# Patient Record
Sex: Female | Born: 1970 | Race: Black or African American | Hispanic: No | Marital: Single | Smoking: Former smoker
Health system: Southern US, Community
[De-identification: ages and names within clinical notes are randomized; demographics above are authoritative.]

## PROBLEM LIST (undated history)

## (undated) DIAGNOSIS — I10 Essential (primary) hypertension: Secondary | ICD-10-CM

## (undated) DIAGNOSIS — R7303 Prediabetes: Secondary | ICD-10-CM

## (undated) DIAGNOSIS — T7840XA Allergy, unspecified, initial encounter: Secondary | ICD-10-CM

## (undated) DIAGNOSIS — S82851A Displaced trimalleolar fracture of right lower leg, initial encounter for closed fracture: Secondary | ICD-10-CM

## (undated) HISTORY — PX: WRIST SURGERY: SHX841

---

## 2000-08-17 ENCOUNTER — Encounter: Admission: RE | Admit: 2000-08-17 | Discharge: 2000-08-17 | Payer: Self-pay | Admitting: Emergency Medicine

## 2000-08-17 ENCOUNTER — Encounter: Payer: Self-pay | Admitting: Emergency Medicine

## 2000-08-23 ENCOUNTER — Encounter: Payer: Self-pay | Admitting: Emergency Medicine

## 2000-08-23 ENCOUNTER — Encounter: Admission: RE | Admit: 2000-08-23 | Discharge: 2000-08-23 | Payer: Self-pay | Admitting: Emergency Medicine

## 2004-10-25 ENCOUNTER — Ambulatory Visit: Payer: Self-pay | Admitting: *Deleted

## 2004-10-25 ENCOUNTER — Ambulatory Visit: Payer: Self-pay | Admitting: Nurse Practitioner

## 2004-11-04 ENCOUNTER — Ambulatory Visit: Payer: Self-pay | Admitting: Nurse Practitioner

## 2005-03-28 ENCOUNTER — Ambulatory Visit: Payer: Self-pay | Admitting: Nurse Practitioner

## 2007-03-20 ENCOUNTER — Ambulatory Visit (HOSPITAL_COMMUNITY): Admission: EM | Admit: 2007-03-20 | Discharge: 2007-03-21 | Payer: Self-pay | Admitting: Emergency Medicine

## 2010-09-27 NOTE — Op Note (Signed)
NAMECHARITIE, HINOTE                  ACCOUNT NO.:  [id number]   MEDICAL RECORD NO.:  [id number]          PATIENT TYPE:  INP   LOCATION:  2550                         FACILITY:  [facility]   PHYSICIAN:  Madelynn Done, MD  DATE OF BIRTH:  05/25/1970   DATE OF PROCEDURE:  03/20/2007  DATE OF DISCHARGE:                               OPERATIVE REPORT   PREOPERATIVE DIAGNOSES:  1. Right forearm complex laceration 12 x 6 cm.  2. Right forearm pit bull dog bite.   POSTOPERATIVE DIAGNOSES:  1. Right forearm complex laceration 12 x 6 cm.  2. Right forearm pit bull dog bite.   ATTENDING SURGEON:  Dr. Bradly Bienenstock who was scrubbed and present for  the entire procedure.   ASSISTANT SURGEON:  None.   PROCEDURE:  1. Debridement of skin and subcutaneous tissue right forearm.  2. Complex wound closure right forearm, 12 x 6 cm.   ANESTHESIA:  General via LMA.   TOURNIQUET TIME:  Less than 15 minutes at 250 mmHg.   INTRAOPERATIVE FINDINGS:  The patient did have a large complex  laceration to the dorsal radial aspect of the forearm over the region of  the brachial radialis extending to the fourth dorsal compartment and  extending to the FCR volarly.  The radial artery was in continuity.  The  superficial radial nerve, there were some terminal branches that were  disrupted.  There were some sensory branches of the radial nerve that  were disrupted.  However, the large portion of the superficial radial  nerve was in continuity.  The first dorsal compartment, second dorsal  compartment, third and fourth dorsal compartments were both in  continuity.  The laceration extended down to the fascia and extensor  retinaculum.  There was some small rents in the fascia.   SURGICAL INDICATIONS:  Mrs. Edmundson as a 40 year old right hand dominant  female who sustained the above-mentioned dog bite.  The patient seen and  evaluated in emergency department and due to complex nature of the  wound, was consented to  proceed to the operating room for the above  treatment.  Risks, benefits and alternatives discussed in detail.  Risks  include but not limited to bleeding, infection, nerve damage, tendon  injury, need for further surgical intervention, skin grafting and loss  of motion of the wrist forearm and digits.   DESCRIPTION OF PROCEDURE:  The patient was properly identified in the  preop holding area and a mark with permanent marker was made on the  right forearm to indicate the correct operative site.  The patient then  brought back to the operating room, placed supine on anesthesia table.  General anesthesia was administered via LMA.  The patient tolerated this  well.  Well-padded tourniquet was then placed on the right brachium and  sealed with a 1000 drape.  The right upper extremity were prepped with  Betadine and sterilely draped.  The wound was then measured.  It  measured 12 cm in largest transverse diameter and the largest width was  6 cm.  Following measurements of the wound  the skin and subcutaneous  tissues were then sharply debrided with a scalpel as well as scissors.  After debridement of the skin and subcutaneous tissues, the wounds were  then thoroughly irrigated with a pulsatile lavage saline solution.  After irrigation and debridement the skin flaps were then advanced and  the wound closed with 3-0 nylon horizontal mattress and simple sutures.  Following completion of the complex wound closure 0.25% Marcaine 15 mL  were then infiltrated around the wound.  The tourniquet was deflated  with good perfusion of the fingers.  The wound was then dressed with  Adaptic and sterile compressive dressing was then applied and the  patient was then placed in a well-padded volar resting hand and wrist  splint made out of plaster.  She was extubated and taken to recovery  room in good condition.   POSTOPERATIVE PLAN:  The patient be discharged to home.  She will go  home on oral antibiotics  and oral pain medication.  She will be seen  back in the office in 5 days for wound check.  Put her back into another  splint at that time and likely sutures out around the 2-week mark.  Continue to follow her based on her wound healing.      Madelynn Done, MD  Electronically Signed     FWO/MEDQ  D:  03/20/2007  T:  03/21/2007  Job:  [id number]

## 2011-07-27 ENCOUNTER — Encounter (INDEPENDENT_AMBULATORY_CARE_PROVIDER_SITE_OTHER): Payer: Self-pay | Admitting: Surgery

## 2017-09-17 ENCOUNTER — Other Ambulatory Visit: Payer: Self-pay

## 2017-09-17 ENCOUNTER — Emergency Department (HOSPITAL_COMMUNITY)
Admission: EM | Admit: 2017-09-17 | Discharge: 2017-09-17 | Disposition: A | Discharge status: Home / Self Care | Payer: BC Managed Care – PPO | Attending: Emergency Medicine | Admitting: Emergency Medicine

## 2017-09-17 ENCOUNTER — Encounter (HOSPITAL_COMMUNITY): Payer: Self-pay

## 2017-09-17 DIAGNOSIS — Z87891 Personal history of nicotine dependence: Secondary | ICD-10-CM | POA: Insufficient documentation

## 2017-09-17 DIAGNOSIS — M542 Cervicalgia: Secondary | ICD-10-CM | POA: Diagnosis not present

## 2017-09-17 MED ORDER — HYDROCODONE-ACETAMINOPHEN 5-325 MG PO TABS: 1.0000 | ORAL_TABLET | Freq: Four times a day (QID) | ORAL | 0 refills | Status: DC | PRN

## 2017-09-17 NOTE — ED Notes (Signed)
ED Provider at bedside. 

## 2017-09-17 NOTE — ED Triage Notes (Signed)
patient c/o right ear pain x 10 days. Patient states it started as nasal congestion. Patient states she went to a physician 6 days ago for an ear infection-antibiotic prescribed. Patient went again today and states that the swelling was decreased. Patient was told by her PCP that she needed to come to the ED since she was still c/o ear pain for possible CT scan.

## 2017-09-18 NOTE — ED Provider Notes (Signed)
[facility] DEPT Provider Note   CSN: [id number] Arrival date & time: 09/17/17  0737     History   Chief Complaint Chief Complaint  Patient presents with  . Otalgia    HPI Claudia Le is a 47 y.o. female.  HPI Patient presents with right-sided ear and neck pain.  Diagnosed with ear infection 6 days ago and started on Augmentin.  States that she followed back up today.  Still has some pain but the swelling is decreased reportedly.  Has some nasal congestion and some pain with swallowing.  No fevers.  States she was sent in for a CAT scan because the doctor did not have what they needed to further evaluate.  No fevers.  Slight difficulty hearing out of that ear.  No difficulty moving her neck.  The pain is below her ear on the right side. History reviewed. No pertinent past medical history.  There are no active problems to display for this patient.   Past Surgical History:  Procedure Laterality Date  . WRIST SURGERY       OB History   None      Home Medications    Prior to Admission medications   Medication Sig Start Date End Date Taking? Authorizing Provider  HYDROcodone-acetaminophen (NORCO/VICODIN) 5-325 MG tablet Take 1-2 tablets by mouth every 6 (six) hours as needed. 09/17/17   Davonna Belling, MD    Family History Family History  Problem Relation Age of Onset  . Thyroid disease Mother   . Glaucoma Father   . Hypertension Brother     Social History Social History   Tobacco Use  . Smoking status: Former Research scientist (life sciences)  . Smokeless tobacco: Never Used  Substance Use Topics  . Alcohol use: Yes    Comment: occasionally  . Drug use: Yes    Types: Marijuana    Comment: occasionally     Allergies   Patient has no known allergies.   Review of Systems Review of Systems  Constitutional: Negative for appetite change.  HENT: Positive for congestion, ear pain and sore throat. Negative for trouble swallowing.   Respiratory:  Negative for shortness of breath.   Cardiovascular: Negative for chest pain.  Gastrointestinal: Negative for abdominal distention.  Genitourinary: Negative for flank pain.  Musculoskeletal: Negative for back pain.  Skin: Negative for rash.     Physical Exam Updated Vital Signs BP (!) 168/94 (BP Location: Right Arm)   Pulse 62   Temp 98.1 F (36.7 C) (Oral)   Resp 18   Ht 5' 2"  (1.575 m)   Wt 90.7 kg (200 lb)   SpO2 100%   BMI 36.58 kg/m   Physical Exam  Constitutional: She appears well-developed.  HENT:  Head: Normocephalic.  Right TM normal.  No tenderness over mastoid.  Some tenderness over musculature of right neck.  Anterior cervical lymphadenopathy.  Good range of motion.  Eyes: EOM are normal.  Neck: Neck supple.  Cardiovascular: Normal rate.  Pulmonary/Chest: Effort normal.  Abdominal: Soft. There is no tenderness.  Musculoskeletal: She exhibits no edema.  Neurological: She is alert.  Skin: Skin is warm.     ED Treatments / Results  Labs (all labs ordered are listed, but only abnormal results are displayed) Labs Reviewed - No data to display  EKG None  Radiology No results found.  Procedures Procedures (including critical care time)  Medications Ordered in ED Medications - No data to display   Initial Impression / Assessment and Plan /  ED Course  I have reviewed the triage vital signs and the nursing notes.  Pertinent labs & imaging results that were available during my care of the patient were reviewed by me and considered in my medical decision making (see chart for details).     Patient with ear pain.  Ear actually looks better than reported previously.  No tenderness over mastoid.  Reportedly sent in to rule out mastoiditis but clinically she does not have a mastoiditis.  No tenderness or swelling of her mastoid.  Pain appears to be down more over the neck.  Good movement of the jaw.  Will continue the antibiotic course and was given some pain  medicines which patient states is what she wanted and was not given from the other provider.  Final Clinical Impressions(s) / ED Diagnoses   Final diagnoses:  Neck pain    ED Discharge Orders        Ordered    HYDROcodone-acetaminophen (NORCO/VICODIN) 5-325 MG tablet  Every 6 hours PRN     09/17/17 1033       Davonna Belling, MD 09/18/17 [phone]

## 2019-07-24 ENCOUNTER — Ambulatory Visit: Payer: BC Managed Care – PPO | Attending: Family

## 2019-07-24 DIAGNOSIS — Z23 Encounter for immunization: Secondary | ICD-10-CM

## 2019-07-24 NOTE — Progress Notes (Signed)
   Covid-19 Vaccination Clinic  Name:  Claudia Le    MRN: [id number] DOB: October 14, 1970  07/24/2019  [name] was observed post Covid-19 immunization for 15 minutes without incident. She was provided with Vaccine Information Sheet and instruction to access the V-Safe system.   [name] was instructed to call 911 with any severe reactions post vaccine: Marland Kitchen Difficulty breathing  . Swelling of face and throat  . A fast heartbeat  . A bad rash all over body  . Dizziness and weakness   Immunizations Administered    Name Date Dose VIS Date Route   Moderna COVID-19 Vaccine 07/24/2019 12:54 PM 0.5 mL 04/15/2019 Intramuscular   Manufacturer: Moderna   Lot: [id number]   [place]: [id number]

## 2019-08-26 ENCOUNTER — Ambulatory Visit: Payer: BC Managed Care – PPO | Attending: Family

## 2019-08-26 DIAGNOSIS — Z23 Encounter for immunization: Secondary | ICD-10-CM

## 2019-08-26 NOTE — Progress Notes (Signed)
   Covid-19 Vaccination Clinic  Name:  ABAGAEL KRAMM    MRN: [id number] DOB: 1971/03/04  08/26/2019  Ms. Cowles was observed post Covid-19 immunization for 15 minutes without incident. She was provided with Vaccine Information Sheet and instruction to access the V-Safe system.   Ms. Thoennes was instructed to call 911 with any severe reactions post vaccine: Marland Kitchen Difficulty breathing  . Swelling of face and throat  . A fast heartbeat  . A bad rash all over body  . Dizziness and weakness   Immunizations Administered    Name Date Dose VIS Date Route   Moderna COVID-19 Vaccine 08/26/2019 11:41 AM 0.5 mL 04/15/2019 Intramuscular   Manufacturer: Moderna   Lot: [id number]   [place]: [id number]

## 2019-10-23 ENCOUNTER — Encounter (HOSPITAL_COMMUNITY): Payer: Self-pay

## 2019-10-23 ENCOUNTER — Emergency Department (HOSPITAL_COMMUNITY): Payer: BC Managed Care – PPO

## 2019-10-23 ENCOUNTER — Emergency Department (HOSPITAL_COMMUNITY)
Admission: EM | Admit: 2019-10-23 | Discharge: 2019-10-23 | Disposition: A | Discharge status: Home / Self Care | Payer: BC Managed Care – PPO | Attending: Emergency Medicine | Admitting: Emergency Medicine

## 2019-10-23 DIAGNOSIS — Y998 Other external cause status: Secondary | ICD-10-CM | POA: Insufficient documentation

## 2019-10-23 DIAGNOSIS — Y939 Activity, unspecified: Secondary | ICD-10-CM | POA: Insufficient documentation

## 2019-10-23 DIAGNOSIS — W19XXXA Unspecified fall, initial encounter: Secondary | ICD-10-CM | POA: Insufficient documentation

## 2019-10-23 DIAGNOSIS — S82851A Displaced trimalleolar fracture of right lower leg, initial encounter for closed fracture: Secondary | ICD-10-CM | POA: Diagnosis not present

## 2019-10-23 DIAGNOSIS — S99911A Unspecified injury of right ankle, initial encounter: Secondary | ICD-10-CM | POA: Diagnosis present

## 2019-10-23 DIAGNOSIS — Z87891 Personal history of nicotine dependence: Secondary | ICD-10-CM | POA: Insufficient documentation

## 2019-10-23 DIAGNOSIS — F1210 Cannabis abuse, uncomplicated: Secondary | ICD-10-CM | POA: Diagnosis not present

## 2019-10-23 DIAGNOSIS — Y929 Unspecified place or not applicable: Secondary | ICD-10-CM | POA: Insufficient documentation

## 2019-10-23 MED ORDER — OXYCODONE HCL 5 MG PO TABS
5.0000 mg | ORAL_TABLET | Freq: Once | ORAL | Status: AC
  Administered 2019-10-23: 5 mg via ORAL
  Filled 2019-10-23: qty 1

## 2019-10-23 MED ORDER — OXYCODONE HCL 5 MG PO TABS: 5.0000 mg | ORAL_TABLET | Freq: Four times a day (QID) | ORAL | 0 refills | Status: AC | PRN

## 2019-10-23 NOTE — ED Triage Notes (Signed)
Pt presents with c/o right ankle pain. Pt reports that she fell this morning, landing on her ankle. Pt does have some swelling to that right ankle.

## 2019-10-23 NOTE — Discharge Instructions (Addendum)
Please do not bear any weight to her right lower extremity.  Use crutches.  Follow-up with orthopedics.  Use Tylenol, Motrin, Roxicodone for pain.  Please call his office today and confirm an appointment for tomorrow.

## 2019-10-23 NOTE — ED Provider Notes (Signed)
[place] DEPT Provider Note   CSN: [id number] Arrival date & time: 10/23/19  1005     History Chief Complaint  Patient presents with  . Fall  . Ankle Pain    Claudia Le is a 49 y.o. female.  The history is provided by the patient.  Ankle Pain Location:  Ankle Injury: yes   Ankle location:  R ankle Pain details:    Quality:  Aching   Radiates to:  Does not radiate   Severity:  Moderate   Onset quality:  Sudden   Timing:  Constant   Progression:  Unchanged Prior injury to area:  No Relieved by:  Nothing Worsened by:  Bearing weight Associated symptoms: decreased ROM   Associated symptoms: no back pain, no fatigue, no fever, no itching, no muscle weakness, no neck pain, no numbness, no stiffness, no swelling and no tingling        History reviewed. No pertinent past medical history.  There are no problems to display for this patient.   Past Surgical History:  Procedure Laterality Date  . WRIST SURGERY       OB History   No obstetric history on file.     Family History  Problem Relation Age of Onset  . Thyroid disease Mother   . Glaucoma Father   . Hypertension Brother     Social History   Tobacco Use  . Smoking status: Former Research scientist (life sciences)  . Smokeless tobacco: Never Used  Vaping Use  . Vaping Use: Never used  Substance Use Topics  . Alcohol use: Yes    Comment: occasionally  . Drug use: Yes    Types: Marijuana    Comment: occasionally    Home Medications Prior to Admission medications   Medication Sig Start Date End Date Taking? Authorizing Provider  HYDROcodone-acetaminophen (NORCO/VICODIN) 5-325 MG tablet Take 1-2 tablets by mouth every 6 (six) hours as needed. 09/17/17   Davonna Belling, MD  oxyCODONE (ROXICODONE) 5 MG immediate release tablet Take 1 tablet (5 mg total) by mouth every 6 (six) hours as needed for up to 20 doses for severe pain or breakthrough pain. 10/23/19   Lennice Sites, DO    Allergies     Patient has no known allergies.  Review of Systems   Review of Systems  Constitutional: Negative for chills, fatigue and fever.  HENT: Negative for ear pain and sore throat.   Eyes: Negative for pain and visual disturbance.  Respiratory: Negative for cough and shortness of breath.   Cardiovascular: Negative for chest pain and palpitations.  Gastrointestinal: Negative for abdominal pain and vomiting.  Genitourinary: Negative for dysuria and hematuria.  Musculoskeletal: Positive for arthralgias and joint swelling. Negative for back pain, neck pain and stiffness.  Skin: Negative for color change, itching and rash.  Neurological: Negative for seizures and syncope.  All other systems reviewed and are negative.   Physical Exam Updated Vital Signs  ED Triage Vitals  Enc Vitals Group     BP 10/23/19 1015 137/84     Pulse Rate 10/23/19 1015 91     Resp 10/23/19 1015 18     Temp 10/23/19 1015 98.2 F (36.8 C)     Temp Source 10/23/19 1015 Oral     SpO2 10/23/19 1015 100 %     Weight --      Height --      Head Circumference --      Peak Flow --      Pain  Score 10/23/19 1016 10     Pain Loc --      Pain Edu? --      Excl. in [place]? --     Physical Exam Constitutional:      General: She is not in acute distress.    Appearance: She is not ill-appearing.  HENT:     Head: Normocephalic.     Nose: Nose normal.     Mouth/Throat:     Mouth: Mucous membranes are moist.  Eyes:     Pupils: Pupils are equal, round, and reactive to light.  Cardiovascular:     Pulses: Normal pulses.  Musculoskeletal:        General: Swelling and tenderness (TTP around right ankle with swelling, decreased ROM) present.     Cervical back: Normal range of motion.  Skin:    General: Skin is warm.  Neurological:     General: No focal deficit present.     Mental Status: She is alert.     Sensory: No sensory deficit.     Motor: No weakness.     ED Results / Procedures / Treatments   Labs (all labs  ordered are listed, but only abnormal results are displayed) Labs Reviewed - No data to display  EKG None  Radiology DG Tibia/Fibula Right  Result Date: 10/23/2019 CLINICAL DATA:  49 year old female with history of trauma from a fall complaining of right ankle pain. EXAM: RIGHT TIBIA AND FIBULA - 2 VIEW COMPARISON:  No priors. FINDINGS: Trimalleolar (anterior, posterior and medial malleolar) fracture of the distal tibia. Known distal fibular fracture not well demonstrated on this examination, but clearly demonstrated on dedicated ankle radiographs. Proximal aspect of the tibia and fibula are otherwise intact. IMPRESSION: 1. Trimalleolar fracture of the distal tibia. 2. Oblique displaced fracture of the distal fibula not well visualized on this examination, but better demonstrated on contemporaneously obtained ankle radiographs. Electronically Signed   By: Vinnie Langton M.D.   On: 10/23/2019 11:50   DG Ankle Complete Right  Result Date: 10/23/2019 CLINICAL DATA:  Pain following fall EXAM: RIGHT ANKLE - COMPLETE 3+ VIEW COMPARISON:  None. FINDINGS: Frontal, oblique, and lateral views were obtained. There is soft tissue swelling. There is an obliquely oriented fracture of the distal fibular diaphysis with mild lateral displacement distally. There is an obliquely oriented comminuted fracture of the medial malleolus with slight separation of fracture fragments. There is no appreciable joint effusion. There is no joint space narrowing or erosion. Ankle mortise appears grossly intact. IMPRESSION: Fractures of the medial malleolus and distal fibula with mild displacement of fracture fragments. Ankle mortise appears grossly intact. No appreciable arthropathy. Electronically Signed   By: Lowella Grip III M.D.   On: 10/23/2019 11:16   DG Foot Complete Right  Result Date: 10/23/2019 CLINICAL DATA:  [place] today.  Right foot and ankle pain. EXAM: RIGHT FOOT COMPLETE - 3+ VIEW COMPARISON:  Ankle  radiographs, same date. FINDINGS: Trimalleolar fracture noted at the ankle. No definite foot fractures are identified. IMPRESSION: 1. Trimalleolar fractures at the ankle. 2. No definite foot fractures. Electronically Signed   By: Marijo Sanes M.D.   On: 10/23/2019 11:43    Procedures Procedures (including critical care time)  Medications Ordered in ED Medications  oxyCODONE (Oxy IR/ROXICODONE) immediate release tablet 5 mg (5 mg Oral Given 10/23/19 1058)    ED Course  I have reviewed the triage vital signs and the nursing notes.  Pertinent labs & imaging results that were available during  my care of the patient were reviewed by me and considered in my medical decision making (see chart for details).    MDM Rules/Calculators/A&P                          SHIRLEEN MCFAUL is a 49 year old female with no significant medical history presents the ED with right ankle pain after fall.  Patient with normal vitals.  No fever.  Neuromuscularly and neurovascularly intact.  There is no breakdown of the skin.  She has a good amount of soft tissue swelling around the right ankle.  Overall ankle mortise appears intact.  X-ray did show trimalleolar fracture of right ankle.  Ankle mortise is intact.  No large joint effusion.  Patient placed in an ankle splint.  Will give crutches.  Will send narcotic pain medicine to pharmacy.  Talked with Dr. Marlou Sa with orthopedics and will follow up with her outpatient.  Discharged in good condition.  This chart was dictated using voice recognition software.  Despite best efforts to proofread,  errors can occur which can change the documentation meaning.   Final Clinical Impression(s) / ED Diagnoses Final diagnoses:  Closed trimalleolar fracture of right ankle, initial encounter    Rx / DC Orders ED Discharge Orders         Ordered    oxyCODONE (ROXICODONE) 5 MG immediate release tablet  Every 6 hours PRN     Discontinue  Reprint     10/23/19 [place], [place], DO 10/23/19 1229

## 2019-10-24 ENCOUNTER — Other Ambulatory Visit: Payer: Self-pay

## 2019-10-24 ENCOUNTER — Encounter: Payer: Self-pay | Admitting: Orthopaedic Surgery

## 2019-10-24 ENCOUNTER — Encounter (HOSPITAL_BASED_OUTPATIENT_CLINIC_OR_DEPARTMENT_OTHER): Payer: Self-pay | Admitting: Orthopaedic Surgery

## 2019-10-24 ENCOUNTER — Ambulatory Visit: Payer: BC Managed Care – PPO | Admitting: Orthopaedic Surgery

## 2019-10-24 DIAGNOSIS — S82851A Displaced trimalleolar fracture of right lower leg, initial encounter for closed fracture: Secondary | ICD-10-CM | POA: Diagnosis not present

## 2019-10-24 NOTE — Progress Notes (Signed)
Office Visit Note   Patient: Claudia Le           Date of Birth: 1970-11-25           MRN: [id number] Visit Date: 10/24/2019              Requested by: No referring provider defined for this encounter. PCP: System, Pcp Not In   Assessment & Plan: Visit Diagnoses:  1. Closed displaced trimalleolar fracture of right ankle, initial encounter     Plan: Impression is right trimalleolar ankle fracture.  Patient refused short leg splint as she is claustrophobic therefore we will have to place her in a cam boot which she understands is not immobilizing.  She will keep this elevated at all times with ice.  We will allow the swelling to subside over the next week and plan for surgical repair later next week.  Risk benefits rehab recovery reviewed.  Anticipate that she will not be able to drive for approximately 3 months.  Questions encouraged and answered.  Follow-Up Instructions: Return for 2 week postop .   Orders:  No orders of the defined types were placed in this encounter.  No orders of the defined types were placed in this encounter.     Procedures: No procedures performed   Clinical Data: No additional findings.   Subjective: Chief Complaint  Patient presents with  . Right Ankle - Pain    Claudia Le is a 49 year old female who is a ER follow-up from yesterday.  She suffered a right ankle injury while she slipped on wet grass coming down the [place].  She was placed in a splint but she could not tolerate the splint and took it off herself.  She endorses swelling.  Denies any numbness and tingling.   Review of Systems  Constitutional: Negative.   HENT: Negative.   Eyes: Negative.   Respiratory: Negative.   Cardiovascular: Negative.   Endocrine: Negative.   Musculoskeletal: Negative.   Neurological: Negative.   Hematological: Negative.   Psychiatric/Behavioral: Negative.   All other systems reviewed and are negative.    Objective: Vital Signs: LMP 10/13/2019 Comment:  periods are very irreg  Physical Exam Vitals and nursing note reviewed.  Constitutional:      Appearance: She is well-developed.  HENT:     Head: Normocephalic and atraumatic.  Pulmonary:     Effort: Pulmonary effort is normal.  Abdominal:     Palpations: Abdomen is soft.  Musculoskeletal:     Cervical back: Neck supple.  Skin:    General: Skin is warm.     Capillary Refill: Capillary refill takes less than 2 seconds.  Neurological:     Mental Status: She is alert and oriented to person, place, and time.  Psychiatric:        Behavior: Behavior normal.        Thought Content: Thought content normal.        Judgment: Judgment normal.     Ortho Exam Right ankle shows moderately severe swelling with a small fracture blister on the medial side.  Neurovascular intact. Specialty Comments:  No specialty comments available.  Imaging: No results found.   PMFS History: There are no problems to display for this patient.  Past Medical History:  Diagnosis Date  . Hypertension   . Pre-diabetes   . Trimalleolar fracture of ankle, closed, right, initial encounter     Family History  Problem Relation Age of Onset  . Thyroid disease Mother   .  Glaucoma Father   . Hypertension Brother     Past Surgical History:  Procedure Laterality Date  . WRIST SURGERY     for dog bite   Social History   Occupational History  . Not on file  Tobacco Use  . Smoking status: Former Research scientist (life sciences)  . Smokeless tobacco: Never Used  Vaping Use  . Vaping Use: Never used  Substance and Sexual Activity  . Alcohol use: Yes    Comment: occasionally  . Drug use: Yes    Types: Marijuana    Comment: occasionally, last smoked 10-20-19  . Sexual activity: Not on file

## 2019-10-27 ENCOUNTER — Other Ambulatory Visit (HOSPITAL_COMMUNITY)
Admission: RE | Admit: 2019-10-27 | Discharge: 2019-10-27 | Disposition: A | Discharge status: Home / Self Care | Payer: BC Managed Care – PPO | Source: Ambulatory Visit | Attending: Orthopaedic Surgery | Admitting: Orthopaedic Surgery

## 2019-10-27 ENCOUNTER — Encounter (HOSPITAL_BASED_OUTPATIENT_CLINIC_OR_DEPARTMENT_OTHER)
Admission: RE | Admit: 2019-10-27 | Discharge: 2019-10-27 | Disposition: A | Discharge status: Home / Self Care | Payer: BC Managed Care – PPO | Source: Ambulatory Visit | Attending: Orthopaedic Surgery | Admitting: Orthopaedic Surgery

## 2019-10-27 DIAGNOSIS — Z20822 Contact with and (suspected) exposure to covid-19: Secondary | ICD-10-CM | POA: Diagnosis not present

## 2019-10-27 DIAGNOSIS — Z01812 Encounter for preprocedural laboratory examination: Secondary | ICD-10-CM | POA: Insufficient documentation

## 2019-10-27 LAB — BASIC METABOLIC PANEL
Anion gap: 16 — ABNORMAL HIGH (ref 5–15)
BUN: 6 mg/dL (ref 6–20)
CO2: 24 mmol/L (ref 22–32)
Calcium: 9.6 mg/dL (ref 8.9–10.3)
Chloride: 99 mmol/L (ref 98–111)
Creatinine, Ser: 0.78 mg/dL (ref 0.44–1.00)
GFR calc Af Amer: >60 mL/min (ref >60)
GFR calc non Af Amer: >60 mL/min (ref >60)
Glucose, Bld: 88 mg/dL (ref 70–99)
Potassium: 3.9 mmol/L (ref 3.5–5.1)
Sodium: 139 mmol/L (ref 135–145)

## 2019-10-27 LAB — SARS CORONAVIRUS 2 (TAT 6-24 HRS): SARS Coronavirus 2: NEGATIVE

## 2019-10-27 LAB — POCT PREGNANCY, URINE: Preg Test, Ur: NEGATIVE

## 2019-10-28 ENCOUNTER — Other Ambulatory Visit: Payer: Self-pay | Admitting: Family

## 2019-10-30 ENCOUNTER — Other Ambulatory Visit: Payer: Self-pay

## 2019-10-30 ENCOUNTER — Encounter (HOSPITAL_BASED_OUTPATIENT_CLINIC_OR_DEPARTMENT_OTHER): Payer: Self-pay | Admitting: Orthopaedic Surgery

## 2019-10-30 ENCOUNTER — Ambulatory Visit (HOSPITAL_BASED_OUTPATIENT_CLINIC_OR_DEPARTMENT_OTHER): Payer: BC Managed Care – PPO | Admitting: Anesthesiology

## 2019-10-30 ENCOUNTER — Encounter (HOSPITAL_BASED_OUTPATIENT_CLINIC_OR_DEPARTMENT_OTHER)
Admission: RE | Disposition: A | Discharge status: Home / Self Care | Payer: Self-pay | Source: Home / Self Care | Attending: Orthopaedic Surgery

## 2019-10-30 ENCOUNTER — Ambulatory Visit (HOSPITAL_COMMUNITY): Payer: BC Managed Care – PPO

## 2019-10-30 ENCOUNTER — Ambulatory Visit (HOSPITAL_BASED_OUTPATIENT_CLINIC_OR_DEPARTMENT_OTHER)
Admission: RE | Admit: 2019-10-30 | Discharge: 2019-10-30 | Disposition: A | Discharge status: Home / Self Care | Payer: BC Managed Care – PPO | Attending: Orthopaedic Surgery | Admitting: Orthopaedic Surgery

## 2019-10-30 DIAGNOSIS — E669 Obesity, unspecified: Secondary | ICD-10-CM | POA: Insufficient documentation

## 2019-10-30 DIAGNOSIS — W010XXA Fall on same level from slipping, tripping and stumbling without subsequent striking against object, initial encounter: Secondary | ICD-10-CM | POA: Diagnosis not present

## 2019-10-30 DIAGNOSIS — R7303 Prediabetes: Secondary | ICD-10-CM | POA: Insufficient documentation

## 2019-10-30 DIAGNOSIS — Z4789 Encounter for other orthopedic aftercare: Secondary | ICD-10-CM

## 2019-10-30 DIAGNOSIS — Z6834 Body mass index (BMI) 34.0-34.9, adult: Secondary | ICD-10-CM | POA: Insufficient documentation

## 2019-10-30 DIAGNOSIS — S82851A Displaced trimalleolar fracture of right lower leg, initial encounter for closed fracture: Secondary | ICD-10-CM

## 2019-10-30 DIAGNOSIS — Z87891 Personal history of nicotine dependence: Secondary | ICD-10-CM | POA: Diagnosis not present

## 2019-10-30 HISTORY — PX: ORIF ANKLE FRACTURE: SHX5408

## 2019-10-30 HISTORY — DX: Prediabetes: R73.03

## 2019-10-30 HISTORY — DX: Allergy, unspecified, initial encounter: T78.40XA

## 2019-10-30 HISTORY — DX: Essential (primary) hypertension: I10

## 2019-10-30 HISTORY — DX: Displaced trimalleolar fracture of right lower leg, initial encounter for closed fracture: S82.851A

## 2019-10-30 SURGERY — OPEN REDUCTION INTERNAL FIXATION (ORIF) ANKLE FRACTURE
Anesthesia: General | Site: Ankle | Laterality: Right

## 2019-10-30 MED ORDER — CEFAZOLIN SODIUM-DEXTROSE 2-4 GM/100ML-% IV SOLN
2.0000 g | INTRAVENOUS | Status: AC
  Administered 2019-10-30: 2 g via INTRAVENOUS

## 2019-10-30 MED ORDER — LIDOCAINE HCL (CARDIAC) PF 100 MG/5ML IV SOSY
PREFILLED_SYRINGE | INTRAVENOUS | Status: DC | PRN
  Administered 2019-10-30: 60 mg via INTRAVENOUS

## 2019-10-30 MED ORDER — FENTANYL CITRATE (PF) 100 MCG/2ML IJ SOLN
INTRAMUSCULAR | Status: AC
  Filled 2019-10-30: qty 2

## 2019-10-30 MED ORDER — MIDAZOLAM HCL 2 MG/2ML IJ SOLN
INTRAMUSCULAR | Status: AC
  Filled 2019-10-30: qty 2

## 2019-10-30 MED ORDER — CEFAZOLIN SODIUM-DEXTROSE 2-4 GM/100ML-% IV SOLN
INTRAVENOUS | Status: AC
  Filled 2019-10-30: qty 100

## 2019-10-30 MED ORDER — BUPIVACAINE-EPINEPHRINE (PF) 0.5% -1:200000 IJ SOLN
INTRAMUSCULAR | Status: DC | PRN
Start: 2019-10-30 — End: 2019-10-30
  Administered 2019-10-30: 30 mL via PERINEURAL

## 2019-10-30 MED ORDER — ROPIVACAINE HCL 5 MG/ML IJ SOLN
INTRAMUSCULAR | Status: DC | PRN
Start: 2019-10-30 — End: 2019-10-30
  Administered 2019-10-30: 20 mL via EPIDURAL

## 2019-10-30 MED ORDER — ASPIRIN EC 81 MG PO TBEC
81.0000 mg | DELAYED_RELEASE_TABLET | Freq: Two times a day (BID) | ORAL | 0 refills | Status: DC
Start: 2019-10-30 — End: 2019-11-21

## 2019-10-30 MED ORDER — FENTANYL CITRATE (PF) 100 MCG/2ML IJ SOLN: 25.0000 ug | INTRAMUSCULAR | Status: DC | PRN

## 2019-10-30 MED ORDER — OXYCODONE-ACETAMINOPHEN 5-325 MG PO TABS: 1.0000 | ORAL_TABLET | Freq: Three times a day (TID) | ORAL | 0 refills | Status: AC | PRN

## 2019-10-30 MED ORDER — ONDANSETRON HCL 4 MG/2ML IJ SOLN
INTRAMUSCULAR | Status: DC | PRN
  Administered 2019-10-30: 4 mg via INTRAVENOUS

## 2019-10-30 MED ORDER — ONDANSETRON HCL 4 MG PO TABS: 4.0000 mg | ORAL_TABLET | Freq: Three times a day (TID) | ORAL | 0 refills | Status: DC | PRN

## 2019-10-30 MED ORDER — DEXAMETHASONE SODIUM PHOSPHATE 10 MG/ML IJ SOLN
INTRAMUSCULAR | Status: AC
  Filled 2019-10-30: qty 1

## 2019-10-30 MED ORDER — OXYCODONE HCL 5 MG PO TABS: 5.0000 mg | ORAL_TABLET | Freq: Once | ORAL | Status: DC | PRN

## 2019-10-30 MED ORDER — PROPOFOL 10 MG/ML IV BOLUS
INTRAVENOUS | Status: DC | PRN
  Administered 2019-10-30: 200 mg via INTRAVENOUS

## 2019-10-30 MED ORDER — ONDANSETRON HCL 4 MG/2ML IJ SOLN
INTRAMUSCULAR | Status: AC
  Filled 2019-10-30: qty 2

## 2019-10-30 MED ORDER — DEXAMETHASONE SODIUM PHOSPHATE 4 MG/ML IJ SOLN
INTRAMUSCULAR | Status: DC | PRN
  Administered 2019-10-30: 8 mg via INTRAVENOUS

## 2019-10-30 MED ORDER — OXYCODONE HCL 5 MG/5ML PO SOLN: 5.0000 mg | Freq: Once | ORAL | Status: DC | PRN

## 2019-10-30 MED ORDER — PROMETHAZINE HCL 25 MG/ML IJ SOLN: 6.2500 mg | INTRAMUSCULAR | Status: DC | PRN

## 2019-10-30 MED ORDER — MIDAZOLAM HCL 2 MG/2ML IJ SOLN
1.0000 mg | INTRAMUSCULAR | Status: DC | PRN
  Administered 2019-10-30: 2 mg via INTRAVENOUS

## 2019-10-30 MED ORDER — LACTATED RINGERS IV SOLN: INTRAVENOUS | Status: DC

## 2019-10-30 MED ORDER — FENTANYL CITRATE (PF) 100 MCG/2ML IJ SOLN
50.0000 ug | INTRAMUSCULAR | Status: AC | PRN
  Administered 2019-10-30 (×3): 50 ug via INTRAVENOUS

## 2019-10-30 SURGICAL SUPPLY — 95 items
BANDAGE ESMARK 6X9 LF (GAUZE/BANDAGES/DRESSINGS) ×1 IMPLANT
BIT DRILL 2.7 QC CANN 155 (BIT) ×1 IMPLANT
BIT DRILL 2.7 QC CANN 155MM (BIT) ×1
BIT DRILL QC 2.5MM SHRT EVO SM (DRILL) IMPLANT
BLADE HEX COATED 2.75 (ELECTRODE) ×2 IMPLANT
BLADE SURG 15 STRL LF DISP TIS (BLADE) ×2 IMPLANT
BLADE SURG 15 STRL SS (BLADE) ×6
BNDG CMPR 9X6 STRL LF SNTH (GAUZE/BANDAGES/DRESSINGS) ×1
BNDG COHESIVE 6X5 TAN STRL LF (GAUZE/BANDAGES/DRESSINGS) ×3 IMPLANT
BNDG ELASTIC 4X5.8 VLCR STR LF (GAUZE/BANDAGES/DRESSINGS) ×2 IMPLANT
BNDG ELASTIC 6X5.8 VLCR STR LF (GAUZE/BANDAGES/DRESSINGS) ×3 IMPLANT
BNDG ESMARK 6X9 LF (GAUZE/BANDAGES/DRESSINGS) ×3
BRUSH SCRUB EZ PLAIN DRY (MISCELLANEOUS) ×3 IMPLANT
CANISTER SUCT 1200ML W/VALVE (MISCELLANEOUS) ×3 IMPLANT
COVER BACK TABLE 60X90IN (DRAPES) ×3 IMPLANT
COVER MAYO STAND STRL (DRAPES) IMPLANT
COVER WAND RF STERILE (DRAPES) IMPLANT
CUFF TOURN SGL QUICK 34 (TOURNIQUET CUFF) ×3
CUFF TRNQT CYL 34X4.125X (TOURNIQUET CUFF) IMPLANT
DECANTER SPIKE VIAL GLASS SM (MISCELLANEOUS) IMPLANT
DRAPE C-ARM 42X72 X-RAY (DRAPES) ×3 IMPLANT
DRAPE C-ARMOR (DRAPES) ×3 IMPLANT
DRAPE EXTREMITY T 121X128X90 (DISPOSABLE) ×3 IMPLANT
DRAPE IMP U-DRAPE 54X76 (DRAPES) ×3 IMPLANT
DRAPE INCISE IOBAN 66X45 STRL (DRAPES) IMPLANT
DRAPE SURG 17X23 STRL (DRAPES) ×6 IMPLANT
DRAPE U-SHAPE 47X51 STRL (DRAPES) ×3 IMPLANT
DRILL QC 2.5MM SHORT EVOS SM (DRILL) ×3
DRSG PAD ABDOMINAL 8X10 ST (GAUZE/BANDAGES/DRESSINGS) ×6 IMPLANT
DRSG TEGADERM 4X10 (GAUZE/BANDAGES/DRESSINGS) ×6 IMPLANT
DRSG TEGADERM 4X4.75 (GAUZE/BANDAGES/DRESSINGS) ×6 IMPLANT
DURAPREP 26ML APPLICATOR (WOUND CARE) ×6 IMPLANT
ELECT REM PT RETURN 9FT ADLT (ELECTROSURGICAL) ×3
ELECTRODE REM PT RTRN 9FT ADLT (ELECTROSURGICAL) ×1 IMPLANT
GAUZE SPONGE 4X4 12PLY STRL (GAUZE/BANDAGES/DRESSINGS) ×3 IMPLANT
GAUZE XEROFORM 1X8 LF (GAUZE/BANDAGES/DRESSINGS) ×3 IMPLANT
GLOVE BIOGEL PI IND STRL 7.0 (GLOVE) ×1 IMPLANT
GLOVE BIOGEL PI INDICATOR 7.0 (GLOVE) ×6
GLOVE ECLIPSE 6.5 STRL STRAW (GLOVE) ×2 IMPLANT
GLOVE ECLIPSE 7.0 STRL STRAW (GLOVE) ×3 IMPLANT
GLOVE SKINSENSE NS SZ7.5 (GLOVE) ×2
GLOVE SKINSENSE STRL SZ7.5 (GLOVE) ×1 IMPLANT
GLOVE SURG SYN 7.5  E (GLOVE) ×3
GLOVE SURG SYN 7.5 E (GLOVE) ×1 IMPLANT
GLOVE SURG SYN 7.5 PF PI (GLOVE) ×1 IMPLANT
GOWN STRL REIN XL XLG (GOWN DISPOSABLE) ×3 IMPLANT
GOWN STRL REUS W/ TWL LRG LVL3 (GOWN DISPOSABLE) ×1 IMPLANT
GOWN STRL REUS W/ TWL XL LVL3 (GOWN DISPOSABLE) ×1 IMPLANT
GOWN STRL REUS W/TWL LRG LVL3 (GOWN DISPOSABLE) ×3
GOWN STRL REUS W/TWL XL LVL3 (GOWN DISPOSABLE) ×3
GUIDE PIN 1.3 (PIN) ×6
MANIFOLD NEPTUNE II (INSTRUMENTS) ×3 IMPLANT
NEEDLE HYPO 22GX1.5 SAFETY (NEEDLE) IMPLANT
NS IRRIG 1000ML POUR BTL (IV SOLUTION) ×3 IMPLANT
PAD CAST 3X4 CTTN HI CHSV (CAST SUPPLIES) IMPLANT
PAD CAST 4YDX4 CTTN HI CHSV (CAST SUPPLIES) IMPLANT
PADDING CAST COTTON 3X4 STRL (CAST SUPPLIES)
PADDING CAST COTTON 4X4 STRL (CAST SUPPLIES) ×3
PADDING CAST COTTON 6X4 STRL (CAST SUPPLIES) ×2 IMPLANT
PADDING CAST SYN 6 (CAST SUPPLIES)
PADDING CAST SYNTHETIC 4 (CAST SUPPLIES)
PADDING CAST SYNTHETIC 4X4 STR (CAST SUPPLIES) ×1 IMPLANT
PADDING CAST SYNTHETIC 6X4 NS (CAST SUPPLIES) ×1 IMPLANT
PENCIL SMOKE EVACUATOR (MISCELLANEOUS) ×3 IMPLANT
PIN GUIDE 1.3 (PIN) IMPLANT
PLATE LOCK EVOS 3.5X94 8H (Plate) ×3 IMPLANT
SCREW CANNULATED 4.0X35 (Screw) ×2 IMPLANT
SCREW CANNULATED 4.0X36 (Screw) ×2 IMPLANT
SCREW CORT 3.5X10MM ST EVOS (Screw) ×6 IMPLANT
SCREW CORT 3.5X16 ST EVOS (Screw) ×2 IMPLANT
SCREW LOCK 3.5X14MM EVOS (Screw) ×2 IMPLANT
SCREW LOCK ST EVOS 3.5X12 (Screw) ×3 IMPLANT
SET BASIN DAY SURGERY F.S. (CUSTOM PROCEDURE TRAY) ×3 IMPLANT
SHEET MEDIUM DRAPE 40X70 STRL (DRAPES) ×3 IMPLANT
SLEEVE SCD COMPRESS KNEE MED (MISCELLANEOUS) ×3 IMPLANT
SPLINT FIBERGLASS 4X30 (CAST SUPPLIES) IMPLANT
SPONGE LAP 18X18 RF (DISPOSABLE) ×3 IMPLANT
STOCKINETTE TUBULAR 6 INCH (GAUZE/BANDAGES/DRESSINGS) IMPLANT
SUCTION FRAZIER HANDLE 10FR (MISCELLANEOUS) ×3
SUCTION TUBE FRAZIER 10FR DISP (MISCELLANEOUS) ×1 IMPLANT
SUT ETHILON 3 0 PS 1 (SUTURE) ×4 IMPLANT
SUT VIC AB 0 CT1 27 (SUTURE) ×6
SUT VIC AB 0 CT1 27XBRD ANBCTR (SUTURE) ×2 IMPLANT
SUT VIC AB 2-0 CT1 27 (SUTURE) ×6
SUT VIC AB 2-0 CT1 TAPERPNT 27 (SUTURE) ×1 IMPLANT
SUT VIC AB 3-0 SH 27 (SUTURE) ×3
SUT VIC AB 3-0 SH 27X BRD (SUTURE) ×1 IMPLANT
SYR BULB EAR ULCER 3OZ GRN STR (SYRINGE) ×2 IMPLANT
SYR CONTROL 10ML LL (SYRINGE) IMPLANT
TOWEL GREEN STERILE FF (TOWEL DISPOSABLE) ×3 IMPLANT
TRAY DSU PREP LF (CUSTOM PROCEDURE TRAY) ×3 IMPLANT
TUBE CONNECTING 20'X1/4 (TUBING) ×1
TUBE CONNECTING 20X1/4 (TUBING) ×2 IMPLANT
UNDERPAD 30X36 HEAVY ABSORB (UNDERPADS AND DIAPERS) ×3 IMPLANT
YANKAUER SUCT BULB TIP NO VENT (SUCTIONS) ×3 IMPLANT

## 2019-10-30 NOTE — Anesthesia Procedure Notes (Signed)
Procedure Name: LMA Insertion Performed by: Tawni Millers, CRNA Pre-anesthesia Checklist: Patient identified, Emergency Drugs available, Suction available and Patient being monitored Patient Re-evaluated:Patient Re-evaluated prior to induction Oxygen Delivery Method: Circle system utilized Preoxygenation: Pre-oxygenation with 100% oxygen Induction Type: IV induction Ventilation: Mask ventilation without difficulty LMA: LMA inserted LMA Size: 4.0 Number of attempts: 1 Airway Equipment and Method: Bite block Placement Confirmation: positive ETCO2 Tube secured with: Tape Dental Injury: Teeth and Oropharynx as per pre-operative assessment

## 2019-10-30 NOTE — Anesthesia Procedure Notes (Signed)
Anesthesia Regional Block: Adductor canal block   Pre-Anesthetic Checklist: ,, timeout performed, Correct Patient, Correct Site, Correct Laterality, Correct Procedure, Correct Position, site marked, Risks and benefits discussed,  Surgical consent,  Pre-op evaluation,  At surgeon's request and post-op pain management  Laterality: Right  Prep: chloraprep       Needles:  Injection technique: Single-shot  Needle Type: Echogenic Needle     Needle Length: 10cm  Needle Gauge: 21     Additional Needles:   Narrative:  Start time: 10/30/2019 12:24 PM End time: 10/30/2019 12:28 PM Injection made incrementally with aspirations every 5 mL.  Performed by: Personally  Anesthesiologist: Audry Pili, MD  Additional Notes: No pain on injection. No increased resistance to injection. Injection made in 5cc increments. Good needle visualization. Patient tolerated the procedure well.

## 2019-10-30 NOTE — Op Note (Signed)
Date of Surgery: 10/30/2019  INDICATIONS: Ms. Steinmetz is a 49 y.o.-year-old female who sustained a right ankle fracture; she was indicated for open reduction and internal fixation due to the displaced nature of the articular fracture and came to the operating room today for this procedure. The patient did consent to the procedure after discussion of the risks and benefits.  PREOPERATIVE DIAGNOSIS: right trimalleolar ankle fracture  POSTOPERATIVE DIAGNOSIS: Same.  PROCEDURE: Open treatment of right ankle fracture with internal fixation. Trimalleolar w/o fixation of posterior malleolus CPT 27822.  SURGEON: N. Eduard Roux, M.D.  ASSIST: none  ANESTHESIA:  General, popliteal block  TOURNIQUET TIME: less than 60 mins  IV FLUIDS AND URINE: See anesthesia.  ESTIMATED BLOOD LOSS: minimal mL.  IMPLANTS: Tamala Julian and Nephew  COMPLICATIONS: see description of procedure.  DESCRIPTION OF PROCEDURE: The patient was brought to the operating room and placed supine on the operating table.  The patient had been signed prior to the procedure and this was documented. The patient had the anesthesia placed by the anesthesiologist.  A nonsterile tourniquet was placed on the upper thigh.  The prep verification and incision time-outs were performed to confirm that this was the correct patient, site, side and location. The patient had an SCD on the opposite lower extremity. The patient did receive antibiotics prior to the incision and was re-dosed during the procedure as needed at indicated intervals.  The patient had the lower extremity prepped and draped in the standard surgical fashion.  The extremity was exsanguinated using an esmarch bandage and the tourniquet was inflated to 300 mm Hg.  I first made an incision on the lateral aspect of the ankle overlying the distal fibula.  Full-thickness flaps were raised.  The fibula was exposed and subperiosteal elevation was performed.  The fracture was visualized.  Organized  hematoma and entrapped periosteum were removed from the fracture site.  The fracture was then mobilized and a reduction was obtained with the use of traction and supination.  A tenaculum clamp was used to help facilitate anatomic reduction.  This was confirmed under fluoroscopy.  Given the orientation of the fracture lag screw fixation was not obtainable therefore we placed a semitubular locking plate on the lateral aspect of the fibula at the appropriate position using fluoroscopic guidance.  We then placed a series of nonlocking and locking screws above and below the fracture in standard AO technique.  The distal screws were unicortical locking in order to avoid the ankle joint.  Each screw had excellent fixation.  I then made a separate incision on the medial side of the ankle over the medial malleolus.  Saphenous branches were mobilized.  A large medial malleolus fracture was identified and the entrapped periosteum was removed from the fracture site.  The fracture was then booked open with a dental pick and the joint was visualized to have no chondral injury.  This was then thoroughly irrigated.  Reduction was then achieved using a dental pick and tenaculum clamp.  2 parallel K wires were advanced across the fracture using fluoroscopic guidance.  2 cannulated screws were then placed over the K wires.  Final x-rays were taken.  Stress exam of the ankle showed no widening of the medial clear space.  The surgical wounds were thoroughly irrigated.  Hemostasis was obtained.  Layered closure was performed.  Sterile dressings were applied.  Patient was placed in a cam boot.  She tolerated the procedure well had no immediate complications.  POSTOPERATIVE PLAN: Ms. Shimkus  will remain nonweightbearing on this leg for approximately 6 weeks; Ms. Boran will return for suture removal in 2 weeks.  He will be immobilized in a short leg splint and then transitioned to a CAM walker at his first follow up appointment.  Ms. Rosten  will receive DVT prophylaxis based on other medications, activity level, and risk ratio of bleeding to thrombosis.  Azucena Cecil, MD [facility] 9:18 PM

## 2019-10-30 NOTE — Anesthesia Procedure Notes (Signed)
Anesthesia Regional Block: Popliteal block   Pre-Anesthetic Checklist: ,, timeout performed, Correct Patient, Correct Site, Correct Laterality, Correct Procedure, Correct Position, site marked, Risks and benefits discussed,  Surgical consent,  Pre-op evaluation,  At surgeon's request and post-op pain management  Laterality: Right  Prep: chloraprep       Needles:  Injection technique: Single-shot  Needle Type: Echogenic Needle     Needle Length: 10cm  Needle Gauge: 21     Additional Needles:   Narrative:  Start time: 10/30/2019 12:28 PM End time: 10/30/2019 12:32 PM Injection made incrementally with aspirations every 5 mL.  Performed by: Personally  Anesthesiologist: Audry Pili, MD  Additional Notes: No pain on injection. No increased resistance to injection. Injection made in 5cc increments. Good needle visualization. Patient tolerated the procedure well.

## 2019-10-30 NOTE — Discharge Instructions (Signed)
    1. Keep splint clean and dry 2. Elevate foot above level of the heart 3. Take aspirin to prevent blood clots 4. Take pain meds as needed 5. Strict non weight bearing to operative extremity   Post Anesthesia Home Care Instructions  Activity: Get plenty of rest for the remainder of the day. A responsible individual must stay with you for 24 hours following the procedure.  For the next 24 hours, DO NOT: -Drive a car -Paediatric nurse -Drink alcoholic beverages -Take any medication unless instructed by your physician -Make any legal decisions or sign important papers.  Meals: Start with liquid foods such as gelatin or soup. Progress to regular foods as tolerated. Avoid greasy, spicy, heavy foods. If nausea and/or vomiting occur, drink only clear liquids until the nausea and/or vomiting subsides. Call your physician if vomiting continues.  Special Instructions/Symptoms: Your throat may feel dry or sore from the anesthesia or the breathing tube placed in your throat during surgery. If this causes discomfort, gargle with warm salt water. The discomfort should disappear within 24 hours.  If you had a scopolamine patch placed behind your ear for the management of post- operative nausea and/or vomiting:  1. The medication in the patch is effective for 72 hours, after which it should be removed.  Wrap patch in a tissue and discard in the trash. Wash hands thoroughly with soap and water. 2. You may remove the patch earlier than 72 hours if you experience unpleasant side effects which may include dry mouth, dizziness or visual disturbances. 3. Avoid touching the patch. Wash your hands with soap and water after contact with the patch.  Regional Anesthesia Blocks  1. Numbness or the inability to move the "blocked" extremity may last from 3-48 hours after placement. The length of time depends on the medication injected and your individual response to the medication. If the numbness is not  going away after 48 hours, call your surgeon.  2. The extremity that is blocked will need to be protected until the numbness is gone and the  Strength has returned. Because you cannot feel it, you will need to take extra care to avoid injury. Because it may be weak, you may have difficulty moving it or using it. You may not know what position it is in without looking at it while the block is in effect.  3. For blocks in the legs and feet, returning to weight bearing and walking needs to be done carefully. You will need to wait until the numbness is entirely gone and the strength has returned. You should be able to move your leg and foot normally before you try and bear weight or walk. You will need someone to be with you when you first try to ensure you do not fall and possibly risk injury.  4. Bruising and tenderness at the needle site are common side effects and will resolve in a few days.  5. Persistent numbness or new problems with movement should be communicated to the surgeon or the [place] [phone]).

## 2019-10-30 NOTE — Transfer of Care (Signed)
Immediate Anesthesia Transfer of Care Note  Patient: Claudia Le  Procedure(s) Performed: OPEN REDUCTION INTERNAL FIXATION (ORIF) RIGHT TRIMALLEOLAR ANKLE FRACTURE (Right Ankle)  Patient Location: PACU  Anesthesia Type:GA combined with regional for post-op pain  Level of Consciousness: awake and oriented  Airway & Oxygen Therapy: Patient Spontanous Breathing and Patient connected to nasal cannula oxygen  Post-op Assessment: Report given to RN and Post -op Vital signs reviewed and stable  Post vital signs: Reviewed and stable  Last Vitals:  Vitals Value Taken Time  BP    Temp    Pulse 86 10/30/19 1444  Resp 12 10/30/19 1444  SpO2 99 % 10/30/19 1444  Vitals shown include unvalidated device data.  Last Pain:  Vitals:   10/30/19 1141  TempSrc: Oral  PainSc: 8          Complications: No complications documented.

## 2019-10-30 NOTE — H&P (Signed)

## 2019-10-30 NOTE — Progress Notes (Signed)
Assisted Dr. Fransisco Beau with right, ultrasound guided, popliteal, adductor canal block. Side rails up, monitors on throughout procedure. See vital signs in flow sheet. Tolerated Procedure well.

## 2019-10-30 NOTE — Anesthesia Preprocedure Evaluation (Addendum)
Anesthesia Evaluation  Patient identified by MRN, date of birth, ID band Patient awake    Reviewed: Allergy & Precautions, NPO status , Patient's Chart, lab work & pertinent test results  History of Anesthesia Complications Negative for: history of anesthetic complications  Airway Mallampati: II  TM Distance: >3 FB Neck ROM: Full    Dental  (+) Dental Advisory Given   Pulmonary former smoker,    Pulmonary exam normal        Cardiovascular hypertension, Pt. on medications Normal cardiovascular exam     Neuro/Psych negative neurological ROS  negative psych ROS   GI/Hepatic negative GI ROS, (+)     substance abuse  marijuana use,   Endo/Other   Obesity Pre-DM   Renal/GU negative Renal ROS     Musculoskeletal negative musculoskeletal ROS (+)   Abdominal   Peds  Hematology negative hematology ROS (+)   Anesthesia Other Findings Covid test negative   Reproductive/Obstetrics                            Anesthesia Physical Anesthesia Plan  ASA: II  Anesthesia Plan: General   Post-op Pain Management:  Regional for Post-op pain   Induction: Intravenous  PONV Risk Score and Plan: 3 and Treatment may vary due to age or medical condition, Ondansetron, Dexamethasone, Midazolam and Scopolamine patch - Pre-op  Airway Management Planned: LMA  Additional Equipment: None  Intra-op Plan:   Post-operative Plan: Extubation in OR  Informed Consent: I have reviewed the patients History and Physical, chart, labs and discussed the procedure including the risks, benefits and alternatives for the proposed anesthesia with the patient or authorized representative who has indicated his/her understanding and acceptance.     Dental advisory given  Plan Discussed with: CRNA and Anesthesiologist  Anesthesia Plan Comments:        Anesthesia Quick Evaluation

## 2019-10-30 NOTE — Anesthesia Postprocedure Evaluation (Signed)
Anesthesia Post Note  Patient: Claudia Le  Procedure(s) Performed: OPEN REDUCTION INTERNAL FIXATION (ORIF) RIGHT TRIMALLEOLAR ANKLE FRACTURE (Right Ankle)     Patient location during evaluation: PACU Anesthesia Type: General Level of consciousness: awake and alert Pain management: pain level controlled Vital Signs Assessment: post-procedure vital signs reviewed and stable Respiratory status: spontaneous breathing, nonlabored ventilation, respiratory function stable and patient connected to nasal cannula oxygen Cardiovascular status: blood pressure returned to baseline and stable Postop Assessment: no apparent nausea or vomiting Anesthetic complications: no   No complications documented.  Last Vitals:  Vitals:   10/30/19 1501 10/30/19 1502  BP: 126/87   Pulse: (!) 59 (!) 58  Resp: 15 17  Temp:    SpO2: 100% 100%    Last Pain:  Vitals:   10/30/19 1500  TempSrc:   PainSc: 0-No pain                 Jarron Curley DAVID

## 2019-10-31 ENCOUNTER — Encounter (HOSPITAL_BASED_OUTPATIENT_CLINIC_OR_DEPARTMENT_OTHER): Payer: Self-pay | Admitting: Orthopaedic Surgery

## 2019-11-14 ENCOUNTER — Ambulatory Visit (INDEPENDENT_AMBULATORY_CARE_PROVIDER_SITE_OTHER): Payer: BC Managed Care – PPO | Admitting: Orthopaedic Surgery

## 2019-11-14 ENCOUNTER — Ambulatory Visit (INDEPENDENT_AMBULATORY_CARE_PROVIDER_SITE_OTHER): Payer: BC Managed Care – PPO

## 2019-11-14 DIAGNOSIS — S82851A Displaced trimalleolar fracture of right lower leg, initial encounter for closed fracture: Secondary | ICD-10-CM | POA: Diagnosis not present

## 2019-11-14 MED ORDER — HYDROCODONE-ACETAMINOPHEN 5-325 MG PO TABS: 1.0000 | ORAL_TABLET | Freq: Every day | ORAL | 0 refills | Status: AC | PRN

## 2019-11-14 MED ORDER — IBUPROFEN 800 MG PO TABS
800.0000 mg | ORAL_TABLET | Freq: Three times a day (TID) | ORAL | 2 refills | Status: AC | PRN
Start: 2019-11-14 — End: ?

## 2019-11-14 NOTE — Progress Notes (Signed)
   Post-Op Visit Note   Patient: Claudia Le           Date of Birth: 05/29/1970           MRN: [id number] Visit Date: 11/14/2019 PCP: System, Pcp Not In   Assessment & Plan:  Chief Complaint:  Chief Complaint  Patient presents with  . Right Ankle - Pain   Visit Diagnoses:  1. Closed displaced trimalleolar fracture of right ankle, initial encounter     Plan: [name] is 2-week status post ORIF right trimalleolar ankle fracture.  Overall doing well.  She still has some tightness and swelling.  Surgical incisions are healed.  Sutures removed today.  Placed back into the cam boot and nonweightbearing.  Hydrocodone and ibuprofen prescribed today.  Recheck in 4 weeks with three-view x-rays of the right ankle.  Follow-Up Instructions: Return in about 4 weeks (around 12/12/2019).   Orders:  Orders Placed This Encounter  Procedures  . XR Ankle Complete Right   Meds ordered this encounter  Medications  . ibuprofen (ADVIL) 800 MG tablet    Sig: Take 1 tablet (800 mg total) by mouth every 8 (eight) hours as needed.    Dispense:  30 tablet    Refill:  2  . HYDROcodone-acetaminophen (NORCO) 5-325 MG tablet    Sig: Take 1 tablet by mouth daily as needed.    Dispense:  14 tablet    Refill:  0    Imaging: XR Ankle Complete Right  Result Date: 11/14/2019 Stable fixation of trimalleolar ankle fracture without complication.   PMFS History: Patient Active Problem List   Diagnosis Date Noted  . Closed displaced trimalleolar fracture of right ankle 10/30/2019   Past Medical History:  Diagnosis Date  . Allergies   . Hypertension   . Pre-diabetes   . Trimalleolar fracture of ankle, closed, right, initial encounter     Family History  Problem Relation Age of Onset  . Thyroid disease Mother   . Glaucoma Father   . Hypertension Brother     Past Surgical History:  Procedure Laterality Date  . ORIF ANKLE FRACTURE Right 10/30/2019   Procedure: OPEN REDUCTION INTERNAL FIXATION (ORIF)  RIGHT TRIMALLEOLAR ANKLE FRACTURE;  Surgeon: Leandrew Koyanagi, MD;  Location: [facility];  Service: Orthopedics;  Laterality: Right;  . WRIST SURGERY     for dog bite   Social History   Occupational History  . Not on file  Tobacco Use  . Smoking status: Former Research scientist (life sciences)  . Smokeless tobacco: Never Used  Vaping Use  . Vaping Use: Never used  Substance and Sexual Activity  . Alcohol use: Yes    Comment: occasionally  . Drug use: Yes    Types: Marijuana    Comment: occasionally, last smoked 10-20-19  . Sexual activity: Not on file

## 2019-11-20 ENCOUNTER — Other Ambulatory Visit: Payer: Self-pay | Admitting: Orthopaedic Surgery

## 2019-11-20 NOTE — Telephone Encounter (Signed)
Pls advise.  

## 2019-11-26 ENCOUNTER — Telehealth: Payer: Self-pay | Admitting: Orthopaedic Surgery

## 2019-11-26 NOTE — Telephone Encounter (Signed)
Patient called asked if she can get a note for her employer stating when she can return to her workplace. Patient said she is now working from home. Patient also asked if any restrictions can also be added to the note.  Patient's email address is [email or web address]   The number to contact patient is [phone]

## 2019-11-26 NOTE — Telephone Encounter (Signed)
She may work from home.  Nonweightbearing.

## 2019-11-26 NOTE — Telephone Encounter (Signed)
Please be specific on what to put on note. Thanks.

## 2019-11-28 ENCOUNTER — Telehealth: Payer: Self-pay | Admitting: Orthopaedic Surgery

## 2019-11-28 NOTE — Telephone Encounter (Signed)
No weight on ankle until follow up appointment.  She may return to work as long she is able to work without putting weight on her ankle.  I'm more than happy to send her back to work with restrictions if she desires.  Let me know.  Thanks.

## 2019-11-28 NOTE — Telephone Encounter (Signed)
Patient wanted note emailed. Emailed her the note.

## 2019-11-28 NOTE — Telephone Encounter (Addendum)
Emailed note.   Patient emailed back. See message below, please advise.    "Good Morning Claudia Le  Thanks for sending.  Does this mean that I cannot return until I am able to put weight on my ankle?  My employer needs to know when I can physically return to the office & if there will be any restrictions.  Is my return to the office dependent on the evaluation at my next appointment?"

## 2019-11-28 NOTE — Telephone Encounter (Signed)
Ready for pick up at the front desk.

## 2019-11-28 NOTE — Telephone Encounter (Signed)
Idk if this is something you can answer.

## 2019-11-28 NOTE — Telephone Encounter (Signed)
Pt called checking on work note that she asked for earlier in the week.

## 2019-12-01 NOTE — Telephone Encounter (Signed)
See message from patient. Please advise.    Good afternoon Claudia Le,  Please let Dr. Erlinda Hong know that I have no desire to return to the office until I can walk without the assistance of crutches & can drive myself to & from the building.   That date will be pending the results of my next appointment with him & could change in the future by the Physical Therapist.  Thanks  Claudia Le Accountant [phone] [email or web address]

## 2019-12-01 NOTE — Telephone Encounter (Signed)
Emailed patient Dr. Phoebe Sharps response. Pending patient's response.

## 2019-12-01 NOTE — Telephone Encounter (Signed)
She's supposed to have a 6 week postop visit to get xrays.  Is she saying that she will not be coming in for this appointment?  We would be getting xrays at that visit to determine her weight bearing status.  I recommend that she come in for that visit.

## 2019-12-03 NOTE — Telephone Encounter (Signed)
I spoke with patient. She states she is coming in for follow up but was needing a note allowing her to work from home until her next appt with you. I made note for her stating she should continue working from home until she saw you back on 07/30

## 2019-12-03 NOTE — Telephone Encounter (Signed)
Thanks

## 2019-12-12 ENCOUNTER — Ambulatory Visit (INDEPENDENT_AMBULATORY_CARE_PROVIDER_SITE_OTHER): Payer: BC Managed Care – PPO

## 2019-12-12 ENCOUNTER — Ambulatory Visit (INDEPENDENT_AMBULATORY_CARE_PROVIDER_SITE_OTHER): Payer: BC Managed Care – PPO | Admitting: Orthopaedic Surgery

## 2019-12-12 ENCOUNTER — Other Ambulatory Visit: Payer: Self-pay | Admitting: Orthopaedic Surgery

## 2019-12-12 DIAGNOSIS — S82851A Displaced trimalleolar fracture of right lower leg, initial encounter for closed fracture: Secondary | ICD-10-CM

## 2019-12-12 NOTE — Progress Notes (Signed)
   Post-Op Visit Note   Patient: Claudia Le           Date of Birth: 1971-03-10           MRN: [id number] Visit Date: 12/12/2019 PCP: System, Pcp Not In   Assessment & Plan:  Chief Complaint:  Chief Complaint  Patient presents with  . Right Ankle - Routine Post Op   Visit Diagnoses:  1. Closed displaced trimalleolar fracture of right ankle, initial encounter     Plan: Claudia Le is 6 weeks s/p ORIF right trimalleolar ankle fracture.  She is overall doing better.  Swelling has improved.  She has occasional shooting pain in the ankle.  No constitutional symptoms.  Surgical scars are fully healed.  Mild swelling.  No neurovascular compromise.  No tenderness to palpation.  X-rays continue to demonstrate progressive healing of the fractures.  At this point we will advance her to weight-bear as tolerated.  She can wean the into the ASO brace as tolerated.  Outpatient physical therapy referral was made.  Recheck in 6 weeks with three-view x-rays of the right ankle.  Follow-Up Instructions: Return in about 6 weeks (around 01/23/2020).   Orders:  No orders of the defined types were placed in this encounter.  No orders of the defined types were placed in this encounter.   Imaging: XR Ankle Complete Right  Result Date: 12/12/2019 Stable fixation of trimalleolar ankle fracture.  No complications.   PMFS History: Patient Active Problem List   Diagnosis Date Noted  . Closed displaced trimalleolar fracture of right ankle 10/30/2019   Past Medical History:  Diagnosis Date  . Allergies   . Hypertension   . Pre-diabetes   . Trimalleolar fracture of ankle, closed, right, initial encounter     Family History  Problem Relation Age of Onset  . Thyroid disease Mother   . Glaucoma Father   . Hypertension Brother     Past Surgical History:  Procedure Laterality Date  . ORIF ANKLE FRACTURE Right 10/30/2019   Procedure: OPEN REDUCTION INTERNAL FIXATION (ORIF) RIGHT TRIMALLEOLAR ANKLE FRACTURE;   Surgeon: Claudia Koyanagi, MD;  Location: [facility];  Service: Orthopedics;  Laterality: Right;  . WRIST SURGERY     for dog bite   Social History   Occupational History  . Not on file  Tobacco Use  . Smoking status: Former Research scientist (life sciences)  . Smokeless tobacco: Never Used  Vaping Use  . Vaping Use: Never used  Substance and Sexual Activity  . Alcohol use: Yes    Comment: occasionally  . Drug use: Yes    Types: Marijuana    Comment: occasionally, last smoked 10-20-19  . Sexual activity: Not on file

## 2019-12-24 ENCOUNTER — Other Ambulatory Visit: Payer: Self-pay | Admitting: Physician Assistant

## 2020-01-27 ENCOUNTER — Encounter: Payer: Self-pay | Admitting: Orthopaedic Surgery

## 2020-01-27 ENCOUNTER — Ambulatory Visit (INDEPENDENT_AMBULATORY_CARE_PROVIDER_SITE_OTHER): Payer: BC Managed Care – PPO | Admitting: Orthopaedic Surgery

## 2020-01-27 ENCOUNTER — Ambulatory Visit: Payer: Self-pay

## 2020-01-27 DIAGNOSIS — S82851A Displaced trimalleolar fracture of right lower leg, initial encounter for closed fracture: Secondary | ICD-10-CM

## 2020-01-27 DIAGNOSIS — M25571 Pain in right ankle and joints of right foot: Secondary | ICD-10-CM | POA: Diagnosis not present

## 2020-01-27 NOTE — Progress Notes (Signed)
   Post-Op Visit Note   Patient: Claudia Le           Date of Birth: 1971-02-29           MRN: [id number] Visit Date: 01/27/2020 PCP: System, Pcp Not In   Assessment & Plan:  Chief Complaint:  Chief Complaint  Patient presents with  . Right Ankle - Pain   Visit Diagnoses:  1. Pain in right ankle and joints of right foot   2. Closed displaced trimalleolar fracture of right ankle, initial encounter     Plan: [name] is about 13 weeks status post ORIF right trimalleolar ankle fracture.  Overall she is doing well.  She still reports some swelling and shooting pains occasionally.  Her lateral toes are numb.  No motor deficits.  She is ambulating at this point without any assistive devices.  Surgical scars are fully healed.  She has mild edema in the ankle region.  Painless range of motion of the ankle.  Decreased sensation in the superficial peroneal nerve distribution.  Impression is 13 weeks status post ORIF right trimalleolar ankle fracture.  At this point she can continue to increase activity as tolerated.  We discussed that the swelling in the shooting pains are to be expected and will improve over time.  Work note was provided to return back to work next Monday without restrictions.  Follow-up with [place] as needed.  Follow-Up Instructions: Return if symptoms worsen or fail to improve.   Orders:  Orders Placed This Encounter  Procedures  . XR Ankle Complete Right   No orders of the defined types were placed in this encounter.   Imaging: XR Ankle Complete Right  Result Date: 01/27/2020 Healed trimalleolar ankle fracture.  No hardware complications.  Disuse osteopenia.   PMFS History: Patient Active Problem List   Diagnosis Date Noted  . Closed displaced trimalleolar fracture of right ankle 10/30/2019   Past Medical History:  Diagnosis Date  . Allergies   . Hypertension   . Pre-diabetes   . Trimalleolar fracture of ankle, closed, right, initial encounter     Family  History  Problem Relation Age of Onset  . Thyroid disease Mother   . Glaucoma Father   . Hypertension Brother     Past Surgical History:  Procedure Laterality Date  . ORIF ANKLE FRACTURE Right 10/30/2019   Procedure: OPEN REDUCTION INTERNAL FIXATION (ORIF) RIGHT TRIMALLEOLAR ANKLE FRACTURE;  Surgeon: Leandrew Koyanagi, MD;  Location: [facility];  Service: Orthopedics;  Laterality: Right;  . WRIST SURGERY     for dog bite   Social History   Occupational History  . Not on file  Tobacco Use  . Smoking status: Former Research scientist (life sciences)  . Smokeless tobacco: Never Used  Vaping Use  . Vaping Use: Never used  Substance and Sexual Activity  . Alcohol use: Yes    Comment: occasionally  . Drug use: Yes    Types: Marijuana    Comment: occasionally, last smoked 10-20-19  . Sexual activity: Not on file

## 2021-12-01 IMAGING — CR DG TIBIA/FIBULA 2V*R*
4 series · 4 of 4 positions shown · non-contrast
Comparison: No priors.

CLINICAL DATA: 48-year-old female with history of trauma from a
fall complaining of right ankle pain.

EXAM:
RIGHT TIBIA AND FIBULA - 2 VIEW

[t tib-fib lat right (1 of 2)]
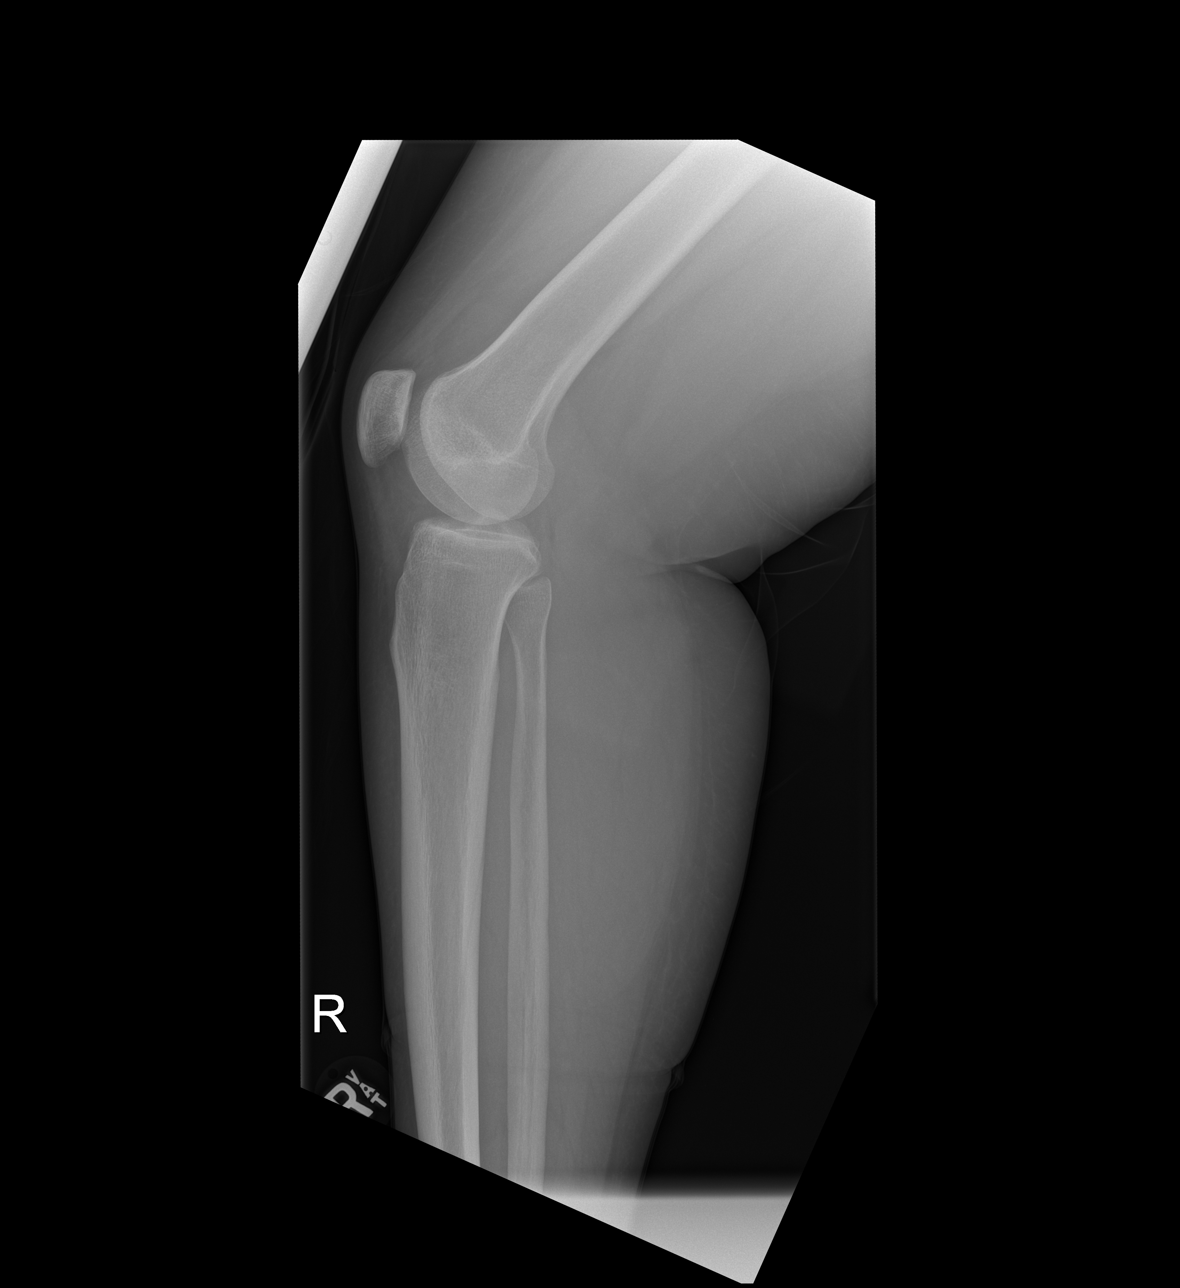

[t tib-fib lat right (2 of 2)]
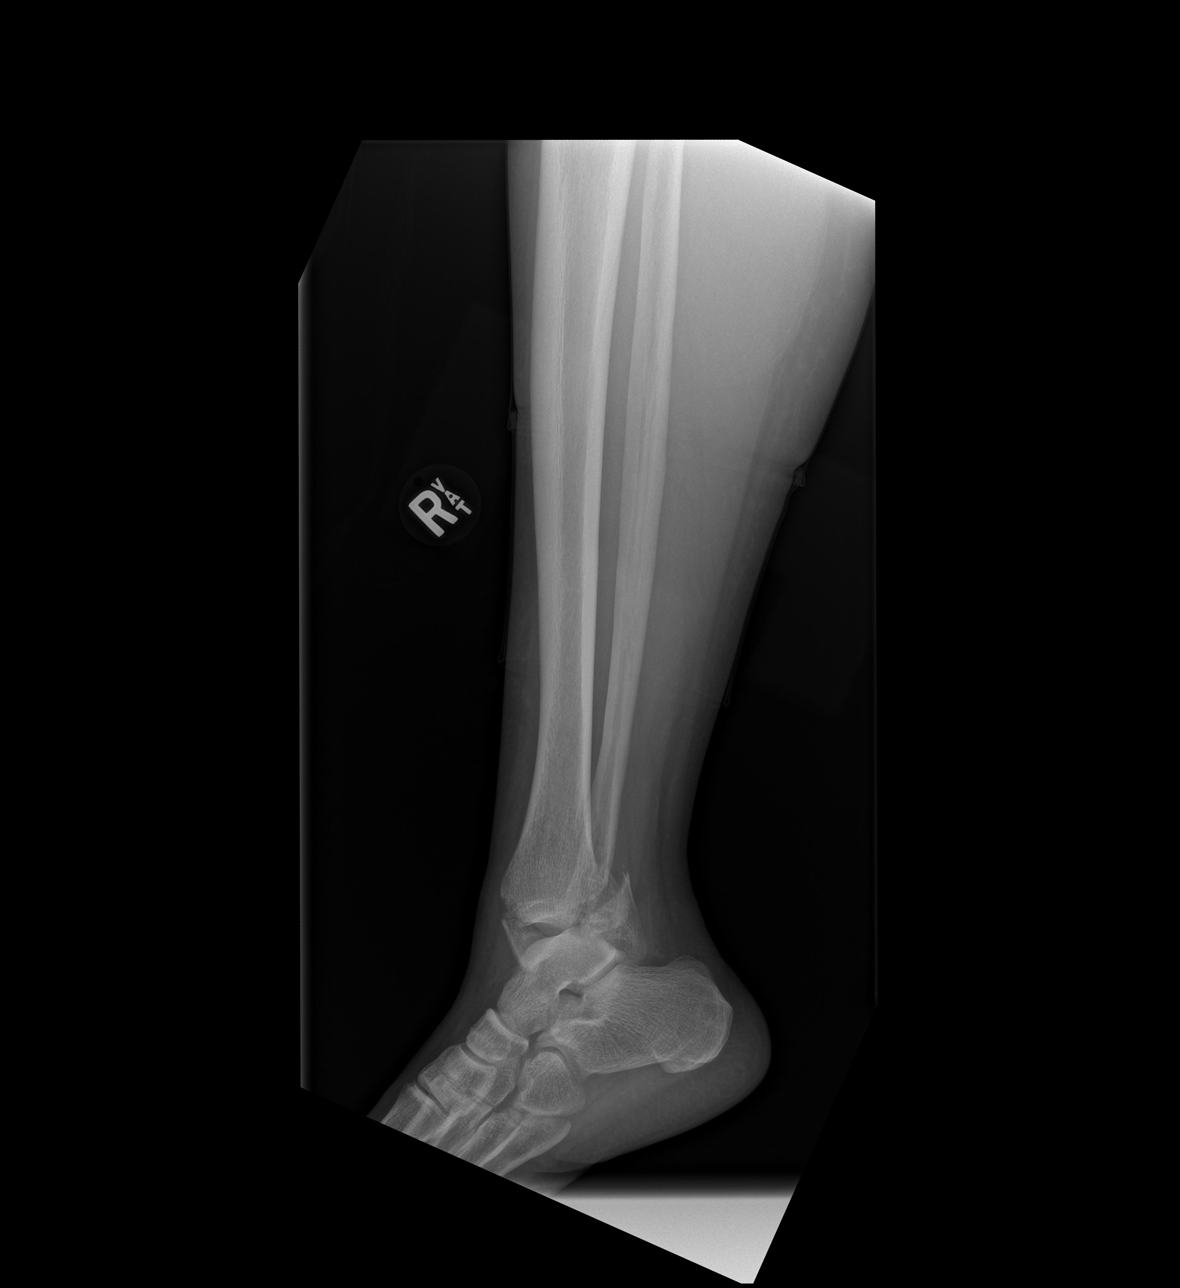

[t tib-fib ap right (1 of 2)]
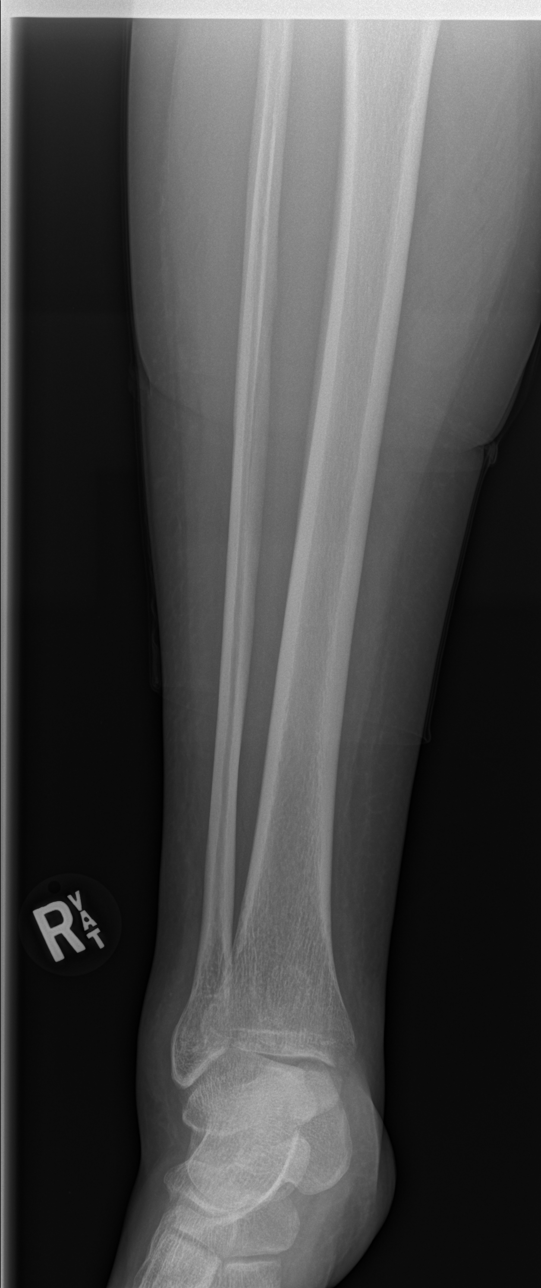

[t tib-fib ap right (2 of 2)]
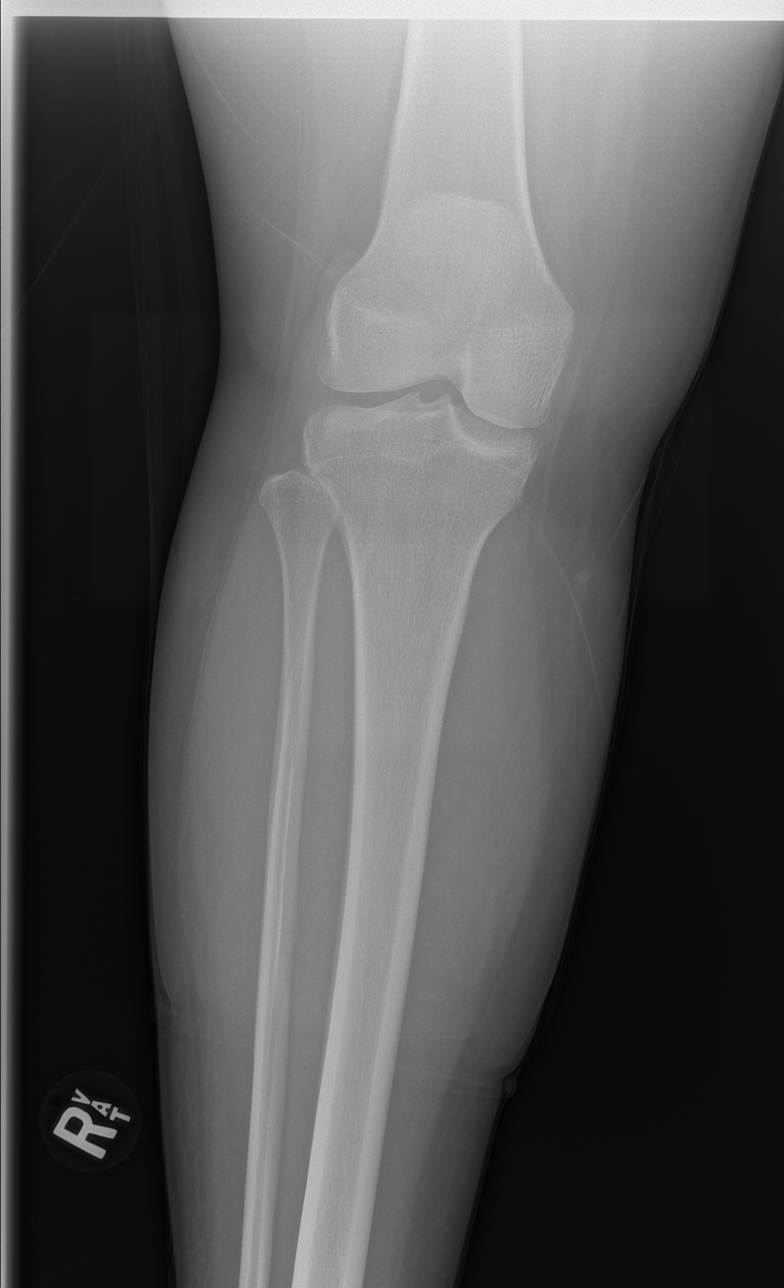

[4 of 4 positions shown; findings below may reference images not displayed]

FINDINGS: Trimalleolar (anterior, posterior and medial malleolar) fracture of
the distal tibia. Known distal fibular fracture not well
demonstrated on this examination, but clearly demonstrated on
dedicated ankle radiographs. Proximal aspect of the tibia and fibula
are otherwise intact.
IMPRESSION: 1. Trimalleolar fracture of the distal tibia.
2. Oblique displaced fracture of the distal fibula not well
visualized on this examination, but better demonstrated on
contemporaneously obtained ankle radiographs.

## 2021-12-01 IMAGING — CR DG FOOT COMPLETE 3+V*R*
3 series · 3 of 3 positions shown · non-contrast
Comparison: Ankle radiographs, same date.

CLINICAL DATA: Fell today.  Right foot and ankle pain.

EXAM:
RIGHT FOOT COMPLETE - 3+ VIEW

[t foot lat right]
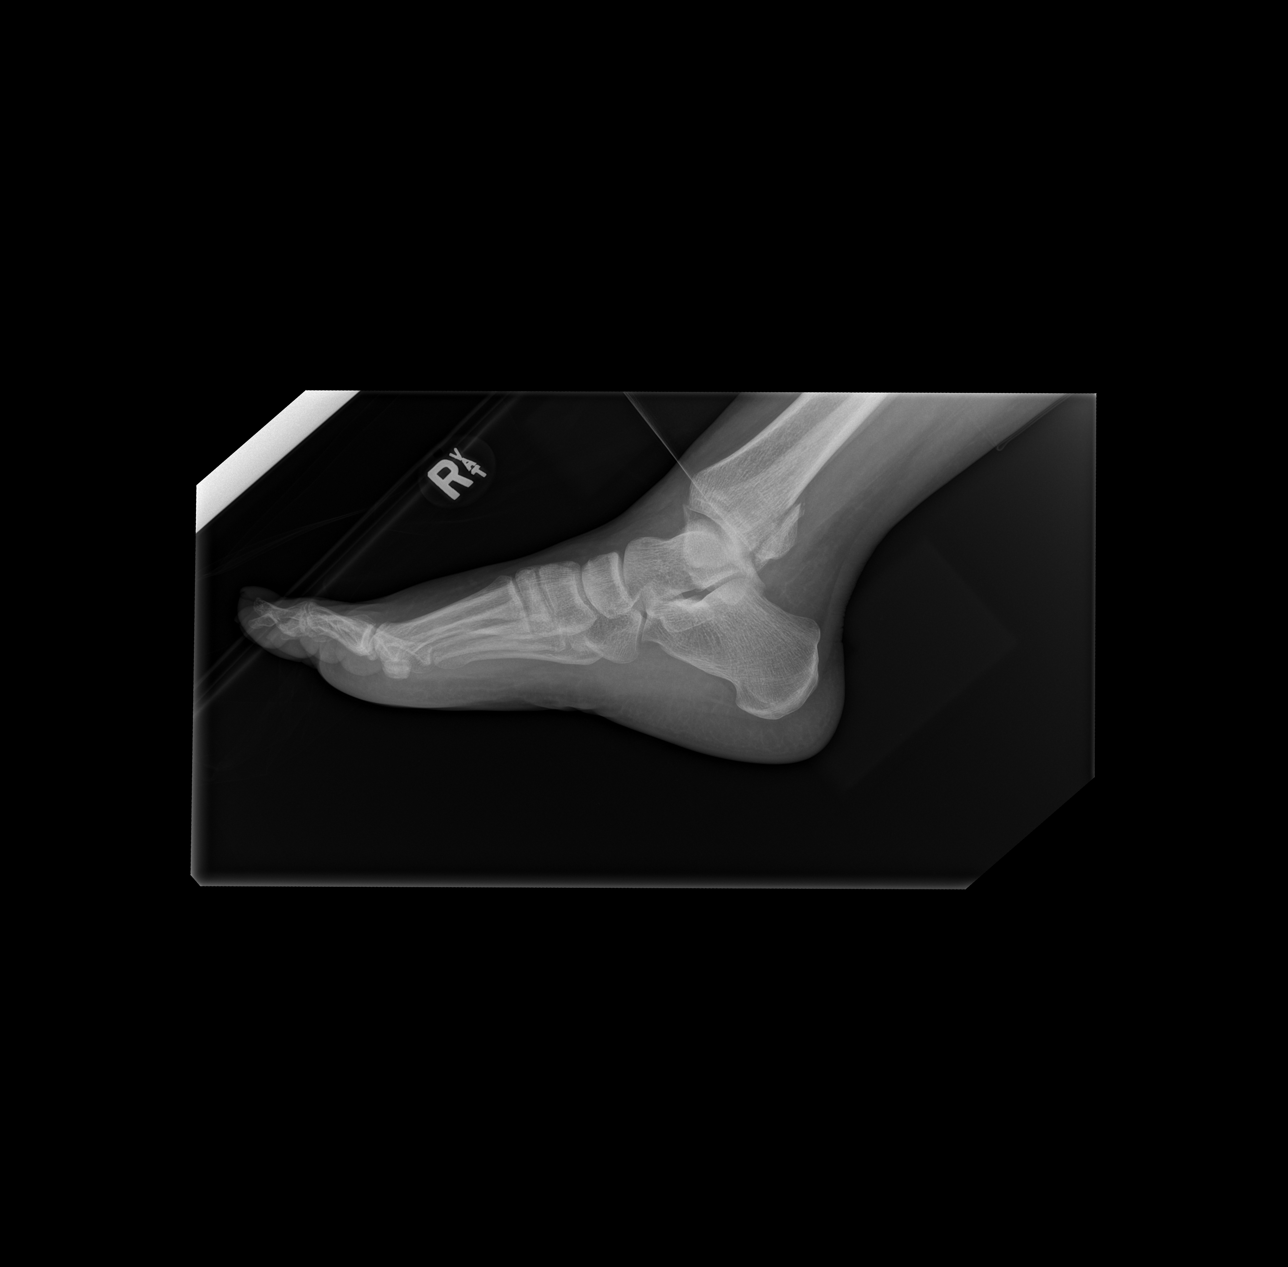

[t foot ap right]
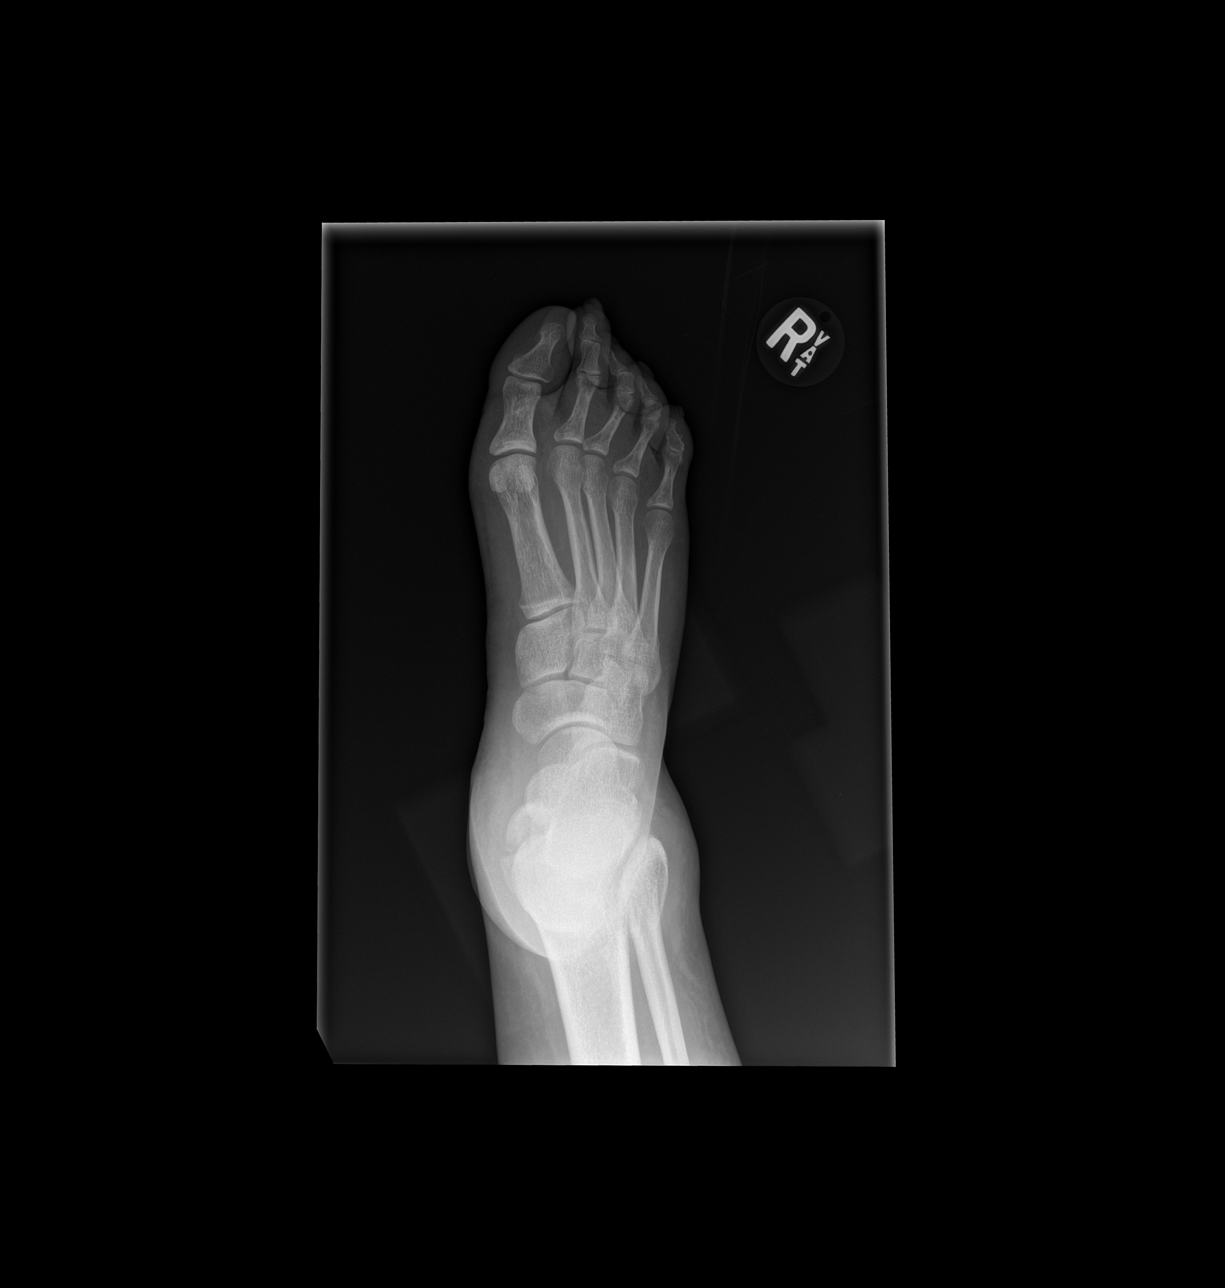

[t foot obl right]
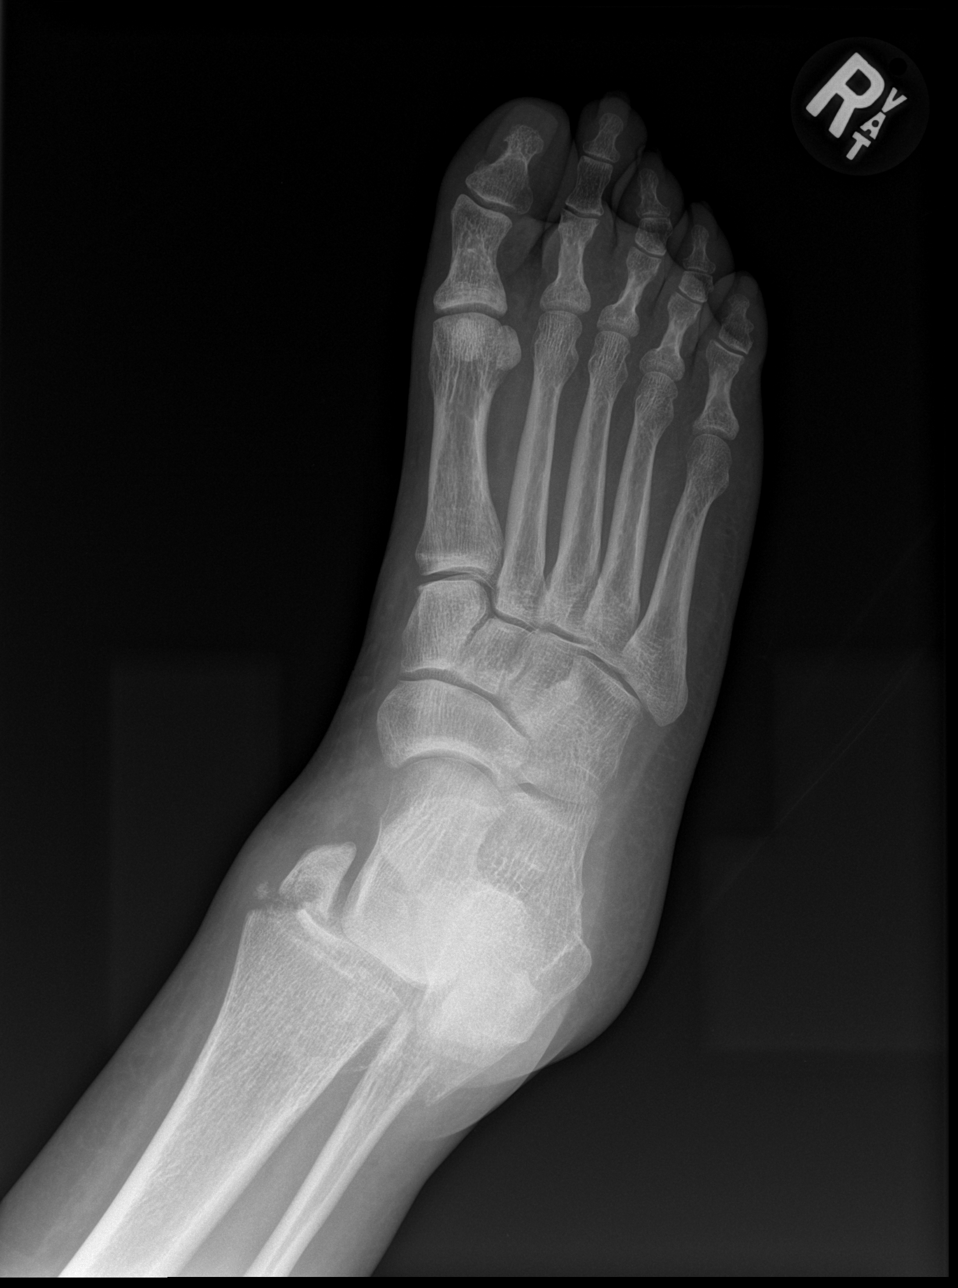

[3 of 3 positions shown; findings below may reference images not displayed]

FINDINGS: Trimalleolar fracture noted at the ankle. No definite foot fractures
are identified.
IMPRESSION: 1. Trimalleolar fractures at the ankle.
2. No definite foot fractures.

## 2021-12-01 IMAGING — CR DG ANKLE COMPLETE 3+V*R*
3 series · 3 of 3 positions shown · non-contrast
Comparison: None.

CLINICAL DATA: Pain following fall

EXAM:
RIGHT ANKLE - COMPLETE 3+ VIEW

[x ankle lat right]
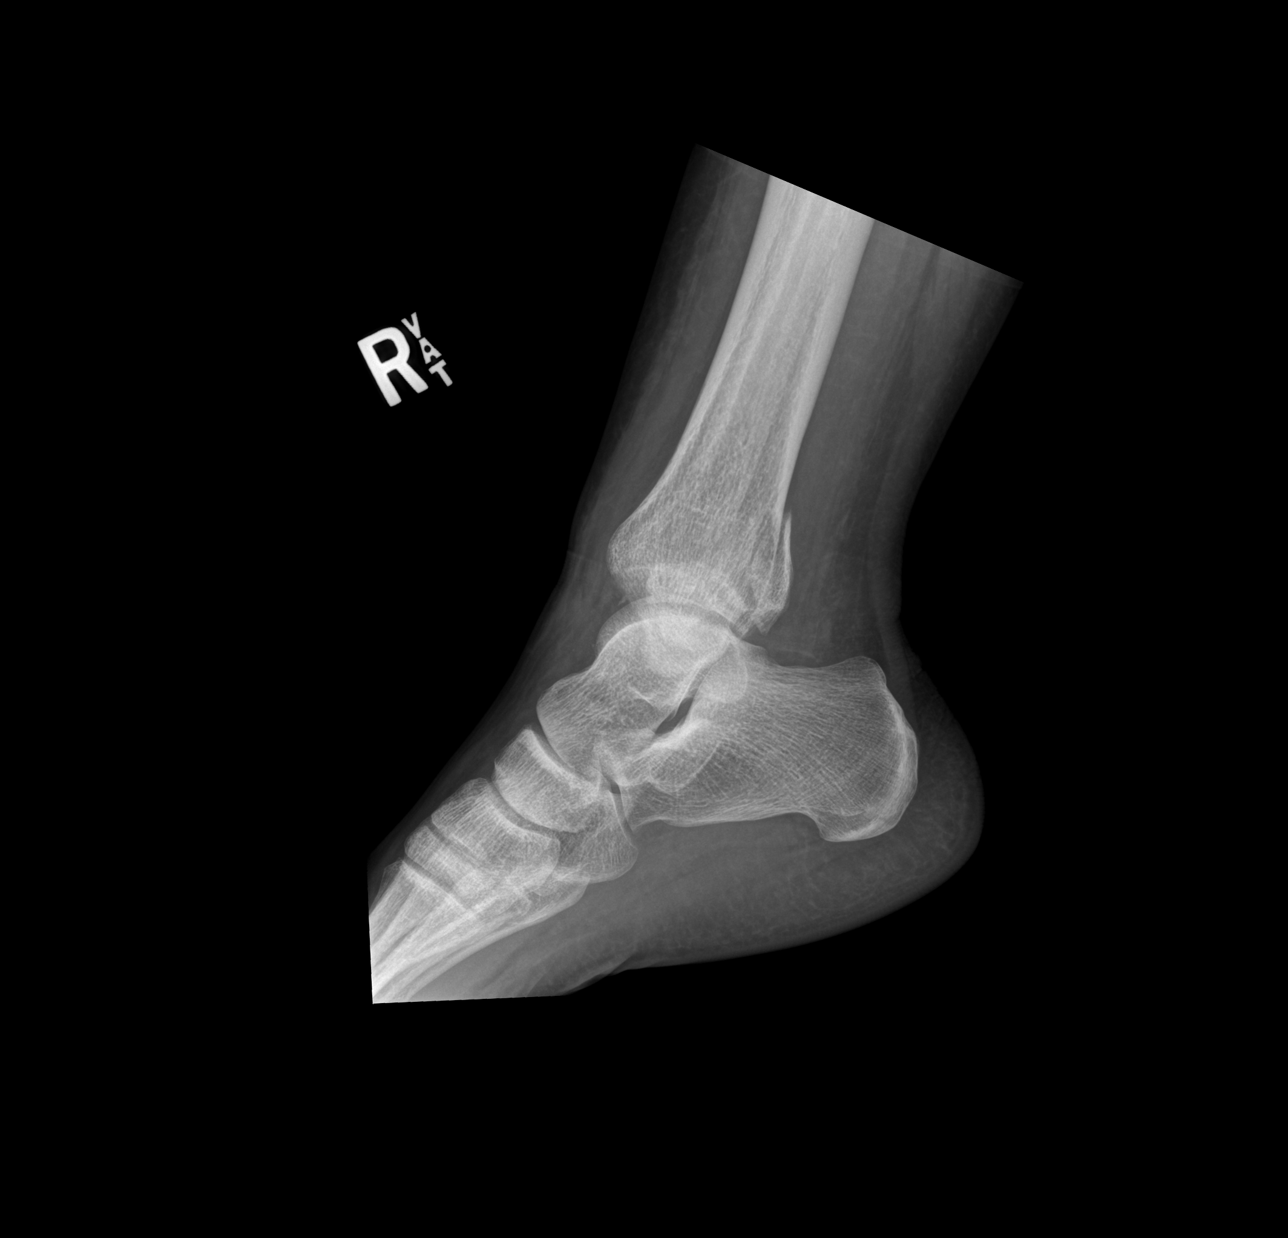

[x ankle ap right]
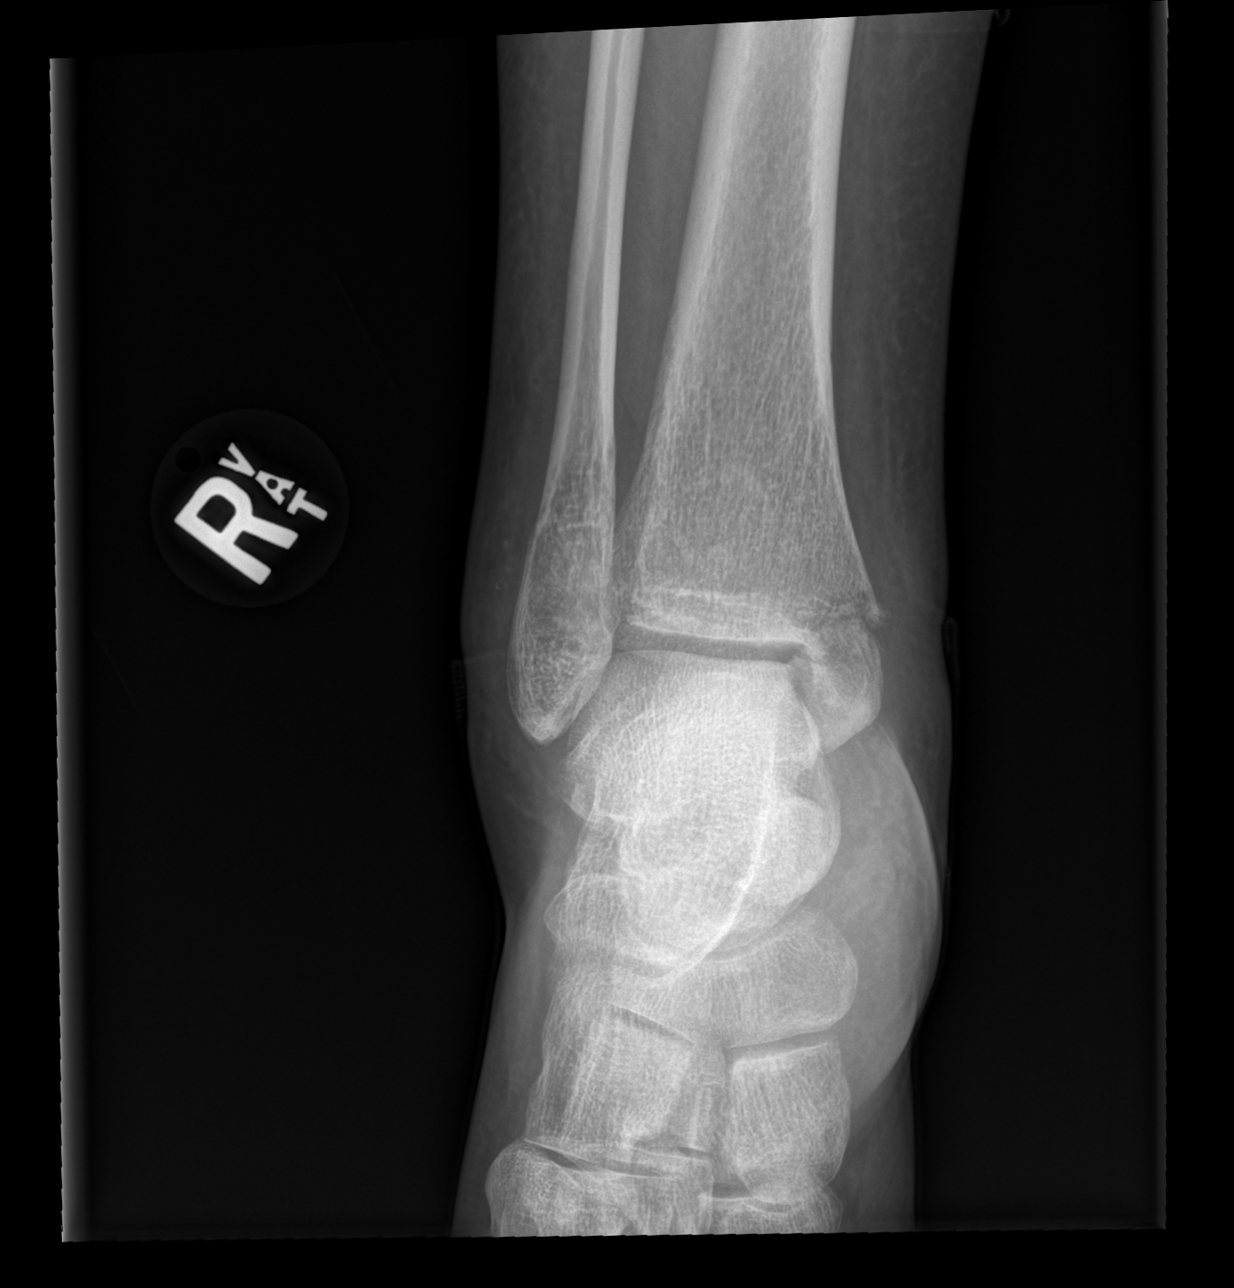

[x ankle obl right]
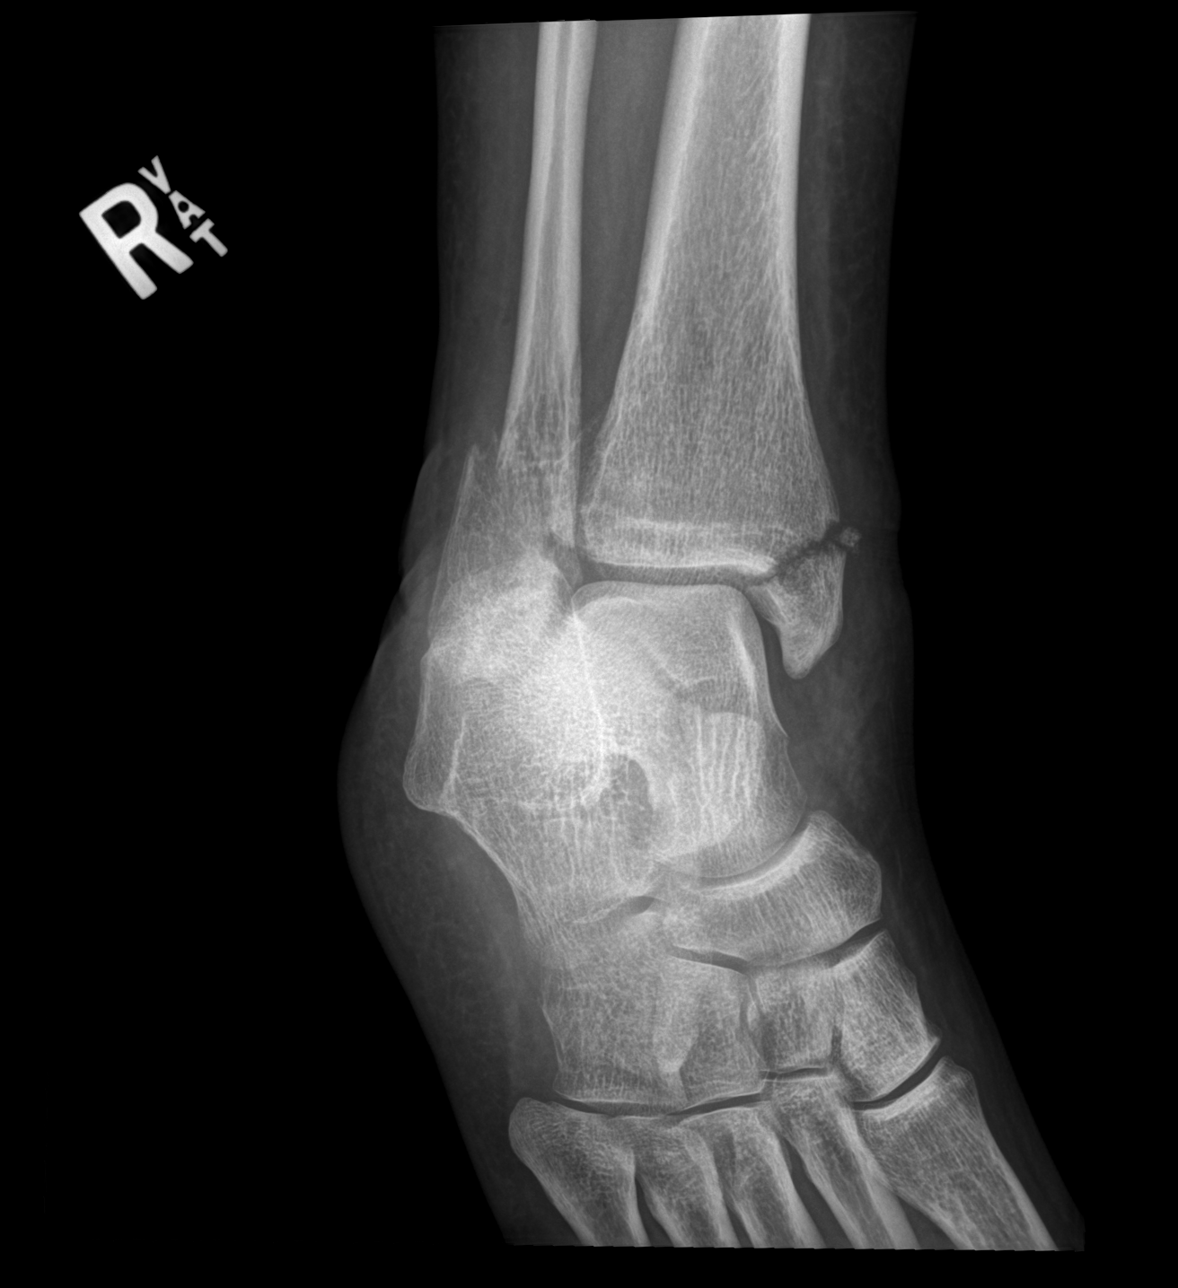

[3 of 3 positions shown; findings below may reference images not displayed]

FINDINGS: Frontal, oblique, and lateral views were obtained. There is soft
tissue swelling. There is an obliquely oriented fracture of the
distal fibular diaphysis with mild lateral displacement distally.
There is an obliquely oriented comminuted fracture of the medial
malleolus with slight separation of fracture fragments. There is no
appreciable joint effusion. There is no joint space narrowing or
erosion. Ankle mortise appears grossly intact.
IMPRESSION: Fractures of the medial malleolus and distal fibula with mild
displacement of fracture fragments. Ankle mortise appears grossly
intact. No appreciable arthropathy.

## 2022-04-21 ENCOUNTER — Other Ambulatory Visit: Payer: Self-pay | Admitting: Nurse Practitioner

## 2022-04-21 DIAGNOSIS — Z1231 Encounter for screening mammogram for malignant neoplasm of breast: Secondary | ICD-10-CM

## 2022-06-19 ENCOUNTER — Ambulatory Visit
Admission: RE | Admit: 2022-06-19 | Discharge: 2022-06-19 | Disposition: A | Discharge status: Home / Self Care | Payer: BC Managed Care – PPO | Source: Ambulatory Visit | Attending: Nurse Practitioner | Admitting: Nurse Practitioner

## 2022-06-19 DIAGNOSIS — Z1231 Encounter for screening mammogram for malignant neoplasm of breast: Secondary | ICD-10-CM

## 2022-08-24 ENCOUNTER — Other Ambulatory Visit: Payer: Self-pay | Admitting: Nurse Practitioner

## 2022-08-24 DIAGNOSIS — Z1231 Encounter for screening mammogram for malignant neoplasm of breast: Secondary | ICD-10-CM
